# Patient Record
Sex: Female | Born: 1990 | Race: White | Hispanic: No | Smoking: Never smoker
Health system: Southern US, Community
[De-identification: ages and names within clinical notes are randomized; demographics above are authoritative.]

## PROBLEM LIST (undated history)

## (undated) ENCOUNTER — Inpatient Hospital Stay (HOSPITAL_COMMUNITY): Payer: Medicaid Other

## (undated) ENCOUNTER — Inpatient Hospital Stay (HOSPITAL_COMMUNITY): Payer: Self-pay

## (undated) DIAGNOSIS — N83209 Unspecified ovarian cyst, unspecified side: Secondary | ICD-10-CM

## (undated) DIAGNOSIS — R001 Bradycardia, unspecified: Secondary | ICD-10-CM

## (undated) DIAGNOSIS — N61 Mastitis without abscess: Principal | ICD-10-CM

## (undated) DIAGNOSIS — R519 Headache, unspecified: Secondary | ICD-10-CM

## (undated) DIAGNOSIS — R569 Unspecified convulsions: Secondary | ICD-10-CM

## (undated) DIAGNOSIS — Z87898 Personal history of other specified conditions: Secondary | ICD-10-CM

## (undated) DIAGNOSIS — R51 Headache: Secondary | ICD-10-CM

## (undated) DIAGNOSIS — G43909 Migraine, unspecified, not intractable, without status migrainosus: Secondary | ICD-10-CM

## (undated) DIAGNOSIS — R87629 Unspecified abnormal cytological findings in specimens from vagina: Secondary | ICD-10-CM

## (undated) DIAGNOSIS — B999 Unspecified infectious disease: Secondary | ICD-10-CM

## (undated) HISTORY — DX: Mastitis without abscess: N61.0

## (undated) HISTORY — DX: Personal history of other specified conditions: Z87.898

## (undated) HISTORY — DX: Unspecified abnormal cytological findings in specimens from vagina: R87.629

---
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
## 2012-04-16 ENCOUNTER — Other Ambulatory Visit (HOSPITAL_COMMUNITY): Payer: Self-pay | Admitting: Family Medicine

## 2012-04-16 DIAGNOSIS — R102 Pelvic and perineal pain: Secondary | ICD-10-CM

## 2012-04-16 DIAGNOSIS — N949 Unspecified condition associated with female genital organs and menstrual cycle: Secondary | ICD-10-CM

## 2012-04-16 DIAGNOSIS — R109 Unspecified abdominal pain: Secondary | ICD-10-CM

## 2012-04-18 ENCOUNTER — Other Ambulatory Visit (HOSPITAL_COMMUNITY): Payer: Self-pay | Admitting: Family Medicine

## 2012-04-18 ENCOUNTER — Ambulatory Visit (HOSPITAL_COMMUNITY)
Admission: RE | Admit: 2012-04-18 | Discharge: 2012-04-18 | Disposition: A | Discharge status: Home / Self Care | Payer: Self-pay | Source: Ambulatory Visit | Attending: Family Medicine | Admitting: Family Medicine

## 2012-04-18 ENCOUNTER — Ambulatory Visit (HOSPITAL_COMMUNITY): Admission: RE | Admit: 2012-04-18 | Payer: Self-pay | Source: Ambulatory Visit

## 2012-04-18 DIAGNOSIS — N949 Unspecified condition associated with female genital organs and menstrual cycle: Secondary | ICD-10-CM

## 2012-04-18 DIAGNOSIS — N83209 Unspecified ovarian cyst, unspecified side: Secondary | ICD-10-CM | POA: Insufficient documentation

## 2012-04-18 DIAGNOSIS — R102 Pelvic and perineal pain unspecified side: Secondary | ICD-10-CM

## 2012-04-18 DIAGNOSIS — R109 Unspecified abdominal pain: Secondary | ICD-10-CM

## 2012-05-25 IMAGING — CT CT ABD-PELV W/O CM
3 of 4 series · 7 of 46 positions shown, 13 images · non-contrast
Comparison: 05/31/2001

CLINICAL DATA: Left-sided flank pain.

CT ABDOMEN AND PELVIS WITHOUT CONTRAST
TECHNIQUE: Multidetector CT imaging of the abdomen and pelvis was
performed following the standard protocol without intravenous
contrast.

[Series 3: lung 5.0 b60f · axial · 0.70mm/px · z∈[+824,+864]mm · 3 of 18 slices shown, 7 images]
[im 5/18  soft-tissue]
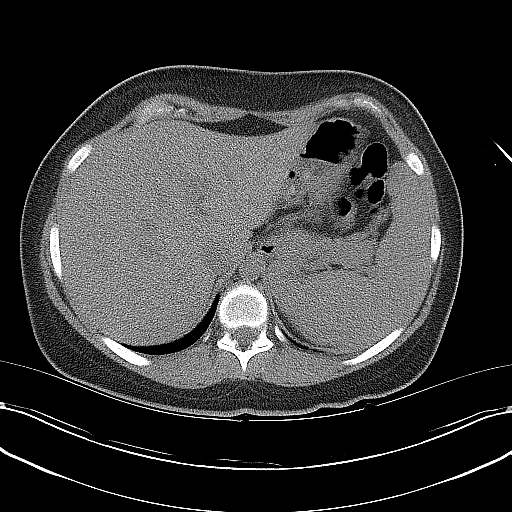
[im 5/18  lung]
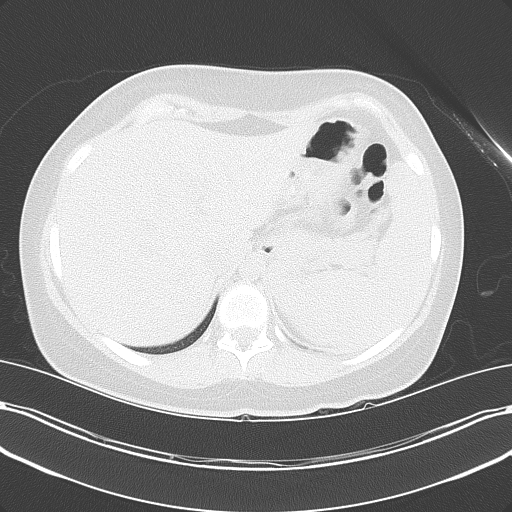
[im 5/18  bone]
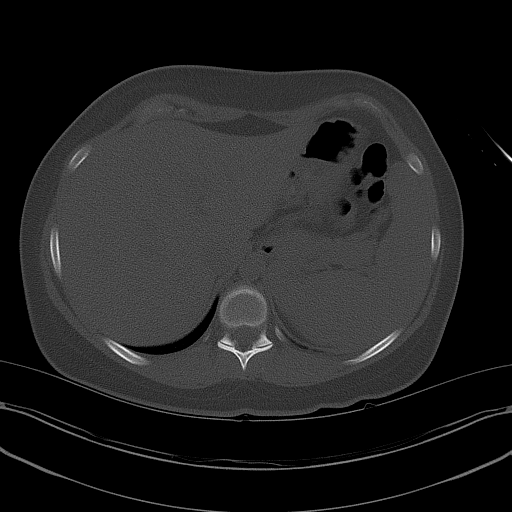
[im 9/18  soft-tissue]
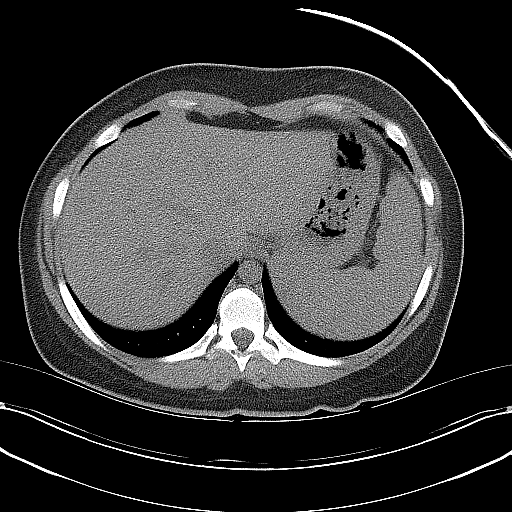
[im 9/18  lung]
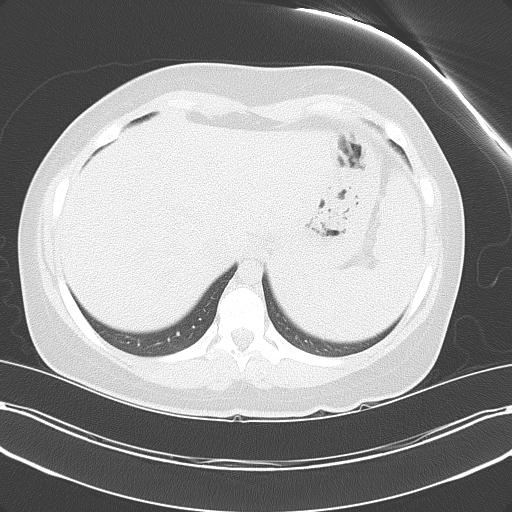
[im 13/18  soft-tissue]
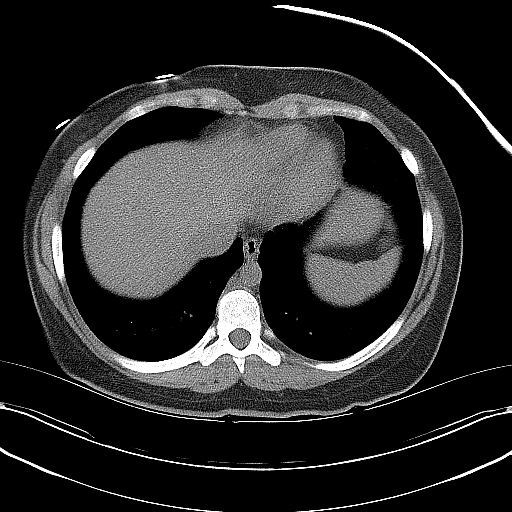
[im 13/18  lung]
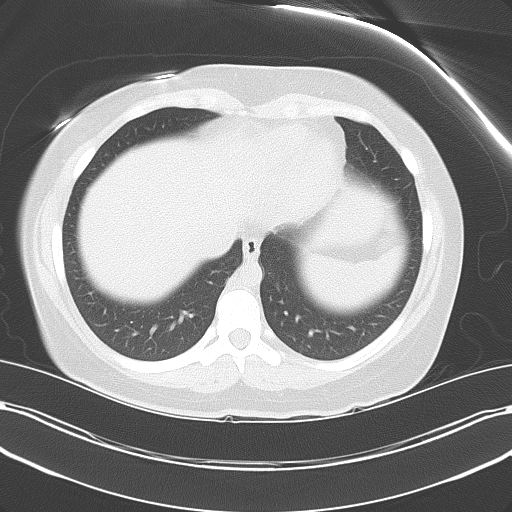

[Series 4: mpr coronal (id) · coronal · 0.67mm/px · 3 of 70 slices shown, 4 images]
[im 24/70  soft-tissue]
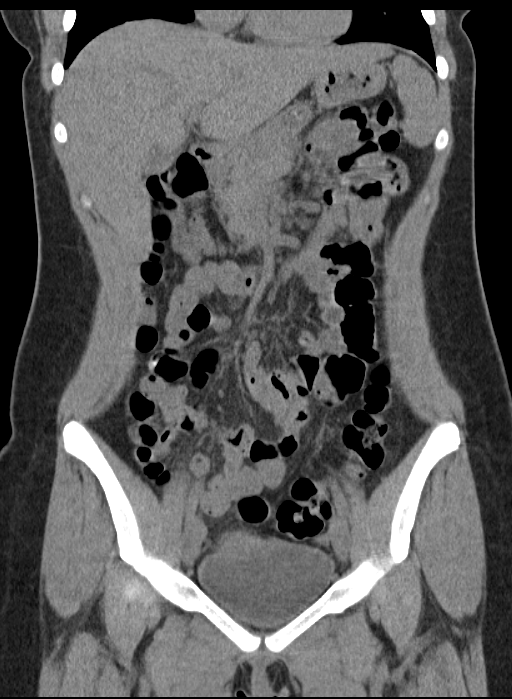
[im 31/70  soft-tissue]
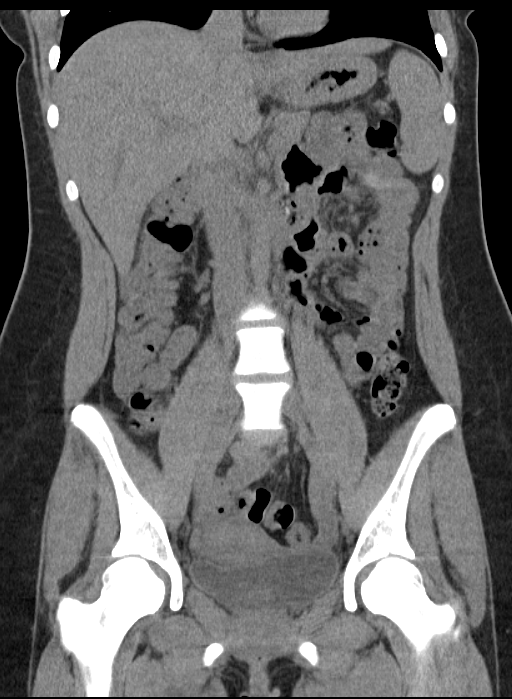
[im 31/70  bone]
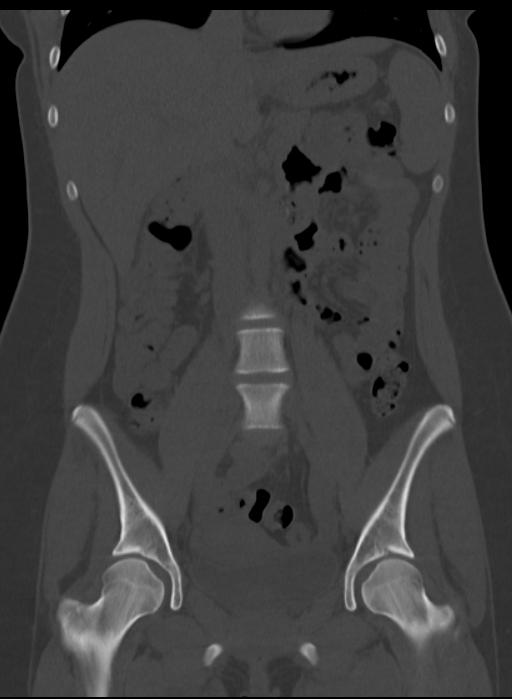
[im 39/70  soft-tissue]
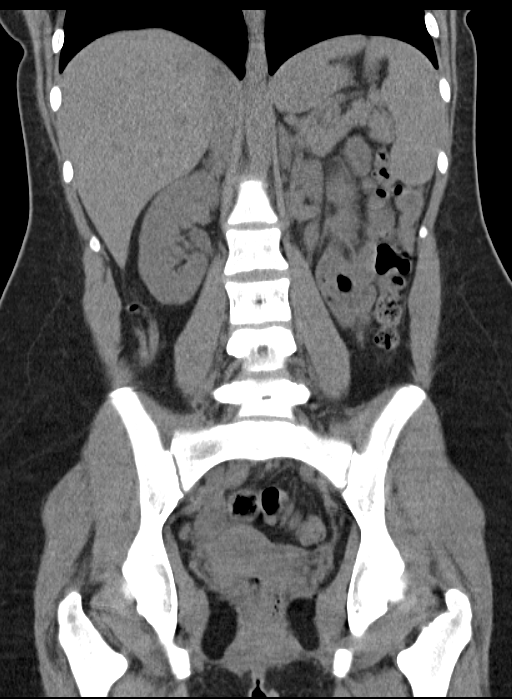

[Series 5: mpr sagittal (id) · sagittal · 0.48mm/px · 1 of 116 slices shown, 2 images]
[im 39/116  soft-tissue]
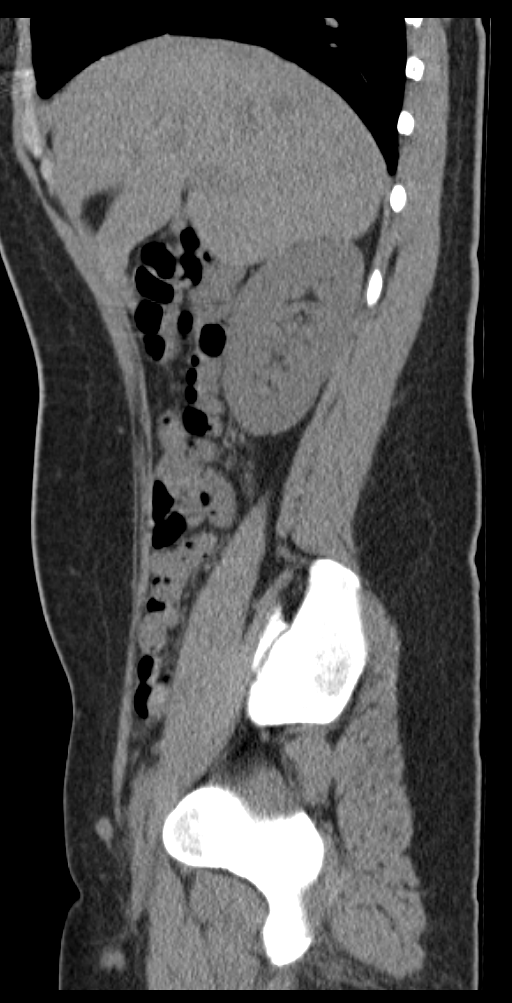
[im 39/116  bone]
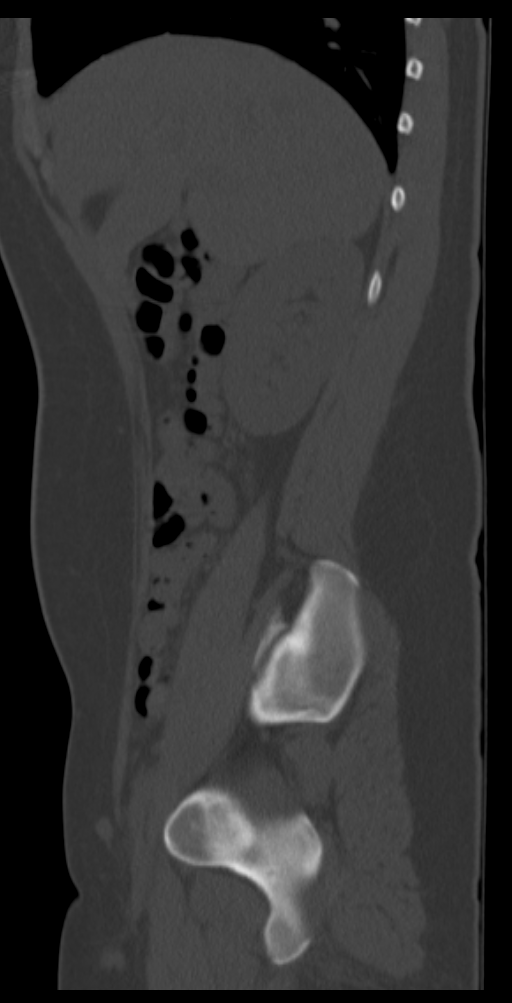

[7 of 46 positions shown; findings below may reference images not displayed]

FINDINGS: Lung bases are clear.  Gallbladder is contracted.  Other
unenhanced abdominal viscera are unremarkable.  No radiopaque renal
or ureteral calculus.

The appendix is normal.  Unenhanced bowel is without focal
segmental dilatation or apparent wall thickening.  Trace pelvic
fluid in the cul-de-sac is likely physiologic in this premenopausal
patient.  Uterus and ovaries are normal.  No lymphadenopathy.  No
free air.  No acute bony abnormality.
IMPRESSION: Trace free pelvic fluid likely physiologic in this premenopausal
female.  No acute intra-abdominal and pelvic pathology otherwise.

## 2012-07-16 ENCOUNTER — Emergency Department (HOSPITAL_COMMUNITY)
Admission: EM | Admit: 2012-07-16 | Discharge: 2012-07-16 | Discharge status: Against Medical Advice | Payer: Medicaid Other | Attending: Emergency Medicine | Admitting: Emergency Medicine

## 2012-07-16 ENCOUNTER — Encounter (HOSPITAL_COMMUNITY): Payer: Self-pay | Admitting: *Deleted

## 2012-07-16 DIAGNOSIS — O9989 Other specified diseases and conditions complicating pregnancy, childbirth and the puerperium: Secondary | ICD-10-CM | POA: Insufficient documentation

## 2012-07-16 DIAGNOSIS — O209 Hemorrhage in early pregnancy, unspecified: Secondary | ICD-10-CM | POA: Insufficient documentation

## 2012-07-16 DIAGNOSIS — R109 Unspecified abdominal pain: Secondary | ICD-10-CM | POA: Insufficient documentation

## 2012-07-16 LAB — US OB COMP LESS 14 WKS

## 2012-07-16 NOTE — ED Notes (Signed)
abd cramping and spotting. Had cramping on Friday and started again today with spotting.  [redacted] weeks pregnant  No N/V

## 2012-07-16 NOTE — ED Notes (Signed)
No answer

## 2012-10-16 ENCOUNTER — Emergency Department (HOSPITAL_COMMUNITY)
Admission: EM | Admit: 2012-10-16 | Discharge: 2012-10-16 | Disposition: A | Discharge status: Home / Self Care | Payer: Medicaid Other | Attending: Emergency Medicine | Admitting: Emergency Medicine

## 2012-10-16 ENCOUNTER — Encounter (HOSPITAL_COMMUNITY): Payer: Self-pay | Admitting: Emergency Medicine

## 2012-10-16 DIAGNOSIS — R197 Diarrhea, unspecified: Secondary | ICD-10-CM | POA: Insufficient documentation

## 2012-10-16 DIAGNOSIS — O210 Mild hyperemesis gravidarum: Secondary | ICD-10-CM | POA: Insufficient documentation

## 2012-10-16 DIAGNOSIS — R112 Nausea with vomiting, unspecified: Secondary | ICD-10-CM

## 2012-10-16 DIAGNOSIS — Z8679 Personal history of other diseases of the circulatory system: Secondary | ICD-10-CM | POA: Insufficient documentation

## 2012-10-16 DIAGNOSIS — R109 Unspecified abdominal pain: Secondary | ICD-10-CM | POA: Insufficient documentation

## 2012-10-16 DIAGNOSIS — Z880 Allergy status to penicillin: Secondary | ICD-10-CM | POA: Insufficient documentation

## 2012-10-16 DIAGNOSIS — Z79899 Other long term (current) drug therapy: Secondary | ICD-10-CM | POA: Insufficient documentation

## 2012-10-16 LAB — POCT PREGNANCY, URINE: Preg Test, Ur: POSITIVE — AB

## 2012-10-16 MED ORDER — PROMETHAZINE HCL 25 MG/ML IJ SOLN
25.0000 mg | Freq: Once | INTRAMUSCULAR | Status: AC
  Administered 2012-10-16: 25 mg via INTRAVENOUS
  Filled 2012-10-16: qty 1

## 2012-10-16 MED ORDER — ONDANSETRON HCL 4 MG PO TABS: 4.0000 mg | ORAL_TABLET | Freq: Four times a day (QID) | ORAL | Status: DC

## 2012-10-16 MED ORDER — ONDANSETRON 4 MG PO TBDP
4.0000 mg | ORAL_TABLET | Freq: Once | ORAL | Status: AC
  Administered 2012-10-16: 4 mg via ORAL
  Filled 2012-10-16: qty 1

## 2012-10-16 MED ORDER — SODIUM CHLORIDE 0.9 % IV BOLUS (SEPSIS)
1000.0000 mL | Freq: Once | INTRAVENOUS | Status: AC
  Administered 2012-10-16: 1000 mL via INTRAVENOUS

## 2012-10-16 NOTE — ED Notes (Signed)
Pt is currently eating ice chips 

## 2012-10-16 NOTE — ED Provider Notes (Signed)
CSN: [id number]     Arrival date & time 10/16/12  0448 History   First MD Initiated Contact with Patient 10/16/12 0550     Chief Complaint  Patient presents with  . Abdominal Pain  . Nausea  . Emesis   (Consider location/radiation/quality/duration/timing/severity/associated sxs/prior Treatment) HPI Hx provided by patient. Nausea vomiting and diarrhea onset last night around 11 PM. Her daughter at home is sick with vomiting tonight as well.  No fevers. Has some on and off abdominal cramping. No blood in emesis or stools. No dysuria. No hematuria. No chest pain or difficulty breathing.   5 mo preg followed by OB in [place], has placenta previa no vag bleeding. No other issues with preg  Past Medical History  Diagnosis Date  . Bradycardia 2012    one episode, 3-day hospitalization, no Rx  . Pregnant    History reviewed. No pertinent past surgical history. History reviewed. No pertinent family history. History  Substance Use Topics  . Smoking status: Never Smoker   . Smokeless tobacco: Never Used  . Alcohol Use: No   OB History   Grav Para Term Preterm Abortions TAB SAB Ect Mult Living   4 2 2  0 1 0 1 0 0 2     Review of Systems  Constitutional: Negative for fever and chills.  Eyes: Negative for pain.  Respiratory: Negative for shortness of breath.   Cardiovascular: Negative for chest pain.  Gastrointestinal: Positive for nausea, vomiting and diarrhea.  Genitourinary: Negative for dysuria.  Musculoskeletal: Negative for back pain, neck pain and neck stiffness.  Skin: Negative for rash.  Neurological: Negative for headaches.  All other systems reviewed and are negative.    Allergies  Penicillins  Home Medications   Current Outpatient Rx  Name  Route  Sig  Dispense  Refill  . ibuprofen (ADVIL,MOTRIN) 600 MG tablet   Oral   Take 1 tablet (600 mg total) by mouth every 6 (six) hours.   30 tablet   0   . Prenatal Vit-Fe Fumarate-FA (PRENATAL MULTIVITAMIN) TABS    Oral   Take 1 tablet by mouth daily.          BP 108/78  Pulse 122  Temp(Src) 98.4 F (36.9 C) (Oral)  Resp 17  Wt 195 lb (88.451 kg)  BMI 35.66 kg/m2  SpO2 97%  LMP 05/28/2012 Physical Exam  Constitutional: She is oriented to person, place, and time. She appears well-developed and well-nourished.  HENT:  Head: Normocephalic and atraumatic.  Mouth/Throat: Oropharynx is clear and moist.  Eyes: EOM are normal. Pupils are equal, round, and reactive to light. No scleral icterus.  Neck: Neck supple.  Cardiovascular: Normal rate, regular rhythm and intact distal pulses.   Pulmonary/Chest: Effort normal and breath sounds normal. No respiratory distress.  Abdominal: Soft. Bowel sounds are normal. She exhibits no distension and no mass. There is no tenderness. There is no rebound and no guarding.  No tenderness with deep palpation throughout  Musculoskeletal: Normal range of motion. She exhibits no edema.  Neurological: She is alert and oriented to person, place, and time.  Skin: Skin is warm and dry.    ED Course  Procedures (including critical care time) Labs Review Labs Reviewed  PREGNANCY, URINE   PO Zofran provided. On recheck abdominal exam unchanged and patient states she is still nauseated. She is requesting IV fluids   7:02 AM feeling much better, tolerating POs. Stable for d/c home and f/u OB GYN. Rx zofran and strict return  precautions verbalized as understood.   MDM  DX: N/V/D, 5 months preg  Symptoms likely viral given sick contact at home. No vag bleeding, ABd pain or tenderness in the ED.  Improved with IVFs and antiemetics VS and nurses notes reviewed    Sunnie Nielsen, MD 10/16/12 [phone]

## 2012-10-16 NOTE — ED Notes (Signed)
Pt c/o abd pain with n/v since 2300

## 2013-02-21 ENCOUNTER — Encounter: Payer: Self-pay | Admitting: Advanced Practice Midwife

## 2013-03-22 ENCOUNTER — Encounter: Payer: Self-pay | Admitting: *Deleted

## 2013-06-24 ENCOUNTER — Emergency Department (HOSPITAL_COMMUNITY)
Admission: EM | Admit: 2013-06-24 | Discharge: 2013-06-24 | Disposition: A | Discharge status: Home / Self Care | Payer: Medicaid Other | Attending: Emergency Medicine | Admitting: Emergency Medicine

## 2013-06-24 ENCOUNTER — Emergency Department (HOSPITAL_COMMUNITY): Payer: Medicaid Other

## 2013-06-24 ENCOUNTER — Encounter (HOSPITAL_COMMUNITY): Payer: Self-pay | Admitting: Emergency Medicine

## 2013-06-24 DIAGNOSIS — Z792 Long term (current) use of antibiotics: Secondary | ICD-10-CM | POA: Insufficient documentation

## 2013-06-24 DIAGNOSIS — Z8679 Personal history of other diseases of the circulatory system: Secondary | ICD-10-CM | POA: Insufficient documentation

## 2013-06-24 DIAGNOSIS — Z3202 Encounter for pregnancy test, result negative: Secondary | ICD-10-CM | POA: Insufficient documentation

## 2013-06-24 DIAGNOSIS — N390 Urinary tract infection, site not specified: Secondary | ICD-10-CM

## 2013-06-24 DIAGNOSIS — Z880 Allergy status to penicillin: Secondary | ICD-10-CM | POA: Insufficient documentation

## 2013-06-24 LAB — URINALYSIS, ROUTINE W REFLEX MICROSCOPIC
Bilirubin Urine: NEGATIVE
Glucose, UA: NEGATIVE mg/dL
Hgb urine dipstick: NEGATIVE
Ketones, ur: NEGATIVE mg/dL
Nitrite: NEGATIVE
Protein, ur: NEGATIVE mg/dL
Specific Gravity, Urine: >1.03 — ABNORMAL HIGH (ref 1.005–1.030)
UROBILINOGEN UA: 0.2 mg/dL (ref 0.0–1.0)
pH: 5.5 (ref 5.0–8.0)

## 2013-06-24 LAB — URINE MICROSCOPIC-ADD ON

## 2013-06-24 LAB — PREGNANCY, URINE: PREG TEST UR: NEGATIVE

## 2013-06-24 MED ORDER — SULFAMETHOXAZOLE-TMP DS 800-160 MG PO TABS: 1.0000 | ORAL_TABLET | Freq: Two times a day (BID) | ORAL | Status: DC

## 2013-06-24 NOTE — ED Notes (Signed)
Abdominal pain times 2 months.  "I think I have an ovarian cyst and UTI"

## 2013-06-24 NOTE — ED Provider Notes (Signed)
CSN: [id number]     Arrival date & time 06/24/13  1046 History  This chart was scribed for American Expressathan R. Rubin PayorPickering, MD by Ardelia Memsylan Malpass, ED Scribe. This patient was seen in room APA17/APA17 and the patient's care was started at 12:12 PM.   Chief Complaint  Patient presents with  . Abdominal Pain    The history is provided by the patient. No language interpreter was used.    HPI Comments: Sandra Frost is a 23 y.o. female who presents to the Emergency Department complaining of intermittent, moderate, sharp LLQ abdominal pain over the past 2 months. She states that she has a history of ovarian cysts and that her current symptoms feel similar. She states that her pain is worsened with walking. She states that she had a vaginal delivery without complication 4 months ago, and that she has since resumed her normal menstrual cycle. She states that she had a normal pelvic exam about 1 month ago. She states that she is not breastfeeding. She denies diarrhea, constipation, vaginal bleeding or discharge, dysuria or any other symptoms.   Past Medical History  Diagnosis Date  . Bradycardia 2012    one episode, 3-day hospitalization, no Rx  . Pregnant    History reviewed. No pertinent past surgical history. Family History  Problem Relation Age of Onset  . Diabetes Mother   . Seizures Father   . Asthma Daughter   . Cancer Maternal Grandmother     breast  . Heart disease Maternal Grandmother   . Stroke Maternal Grandfather   . Cancer Paternal Grandmother     breast   History  Substance Use Topics  . Smoking status: Never Smoker   . Smokeless tobacco: Never Used  . Alcohol Use: No   OB History   Grav Para Term Preterm Abortions TAB SAB Ect Mult Living   4 2 2  0 1 0 1 0 0 2     Review of Systems  Gastrointestinal: Positive for abdominal pain. Negative for diarrhea and constipation.  Genitourinary: Negative for dysuria, vaginal bleeding and vaginal discharge.  All other systems reviewed and  are negative.   Allergies  Penicillins  Home Medications   Prior to Admission medications   Medication Sig Start Date End Date Taking? Authorizing Provider  sulfamethoxazole-trimethoprim (BACTRIM DS) 800-160 MG per tablet Take 1 tablet by mouth 2 (two) times daily. 06/24/13   Juliet RudeNathan R. Pickering, MD   Triage Vitals: BP 117/76  Pulse 89  Temp(Src) 98 F (36.7 C) (Oral)  Resp 18  SpO2 100%  LMP 05/28/2012  Physical Exam  Nursing note and vitals reviewed. Constitutional: She is oriented to person, place, and time. She appears well-developed and well-nourished. No distress.  HENT:  Head: Normocephalic and atraumatic.  Eyes: Conjunctivae and EOM are normal.  Neck: Neck supple. No tracheal deviation present.  Cardiovascular: Normal rate.   Pulmonary/Chest: Effort normal. No respiratory distress.  Abdominal: Soft. There is tenderness. There is no rebound and no guarding.  Genitourinary:  No CVA tenderness  Musculoskeletal: Normal range of motion.  Neurological: She is alert and oriented to person, place, and time.  Skin: Skin is warm and dry. No rash noted.  Psychiatric: She has a normal mood and affect. Her behavior is normal.    ED Course  Procedures (including critical care time)  DIAGNOSTIC STUDIES: Oxygen Saturation is 100% on RA, normal by my interpretation.    COORDINATION OF CARE: 12:16 PM- Discussed plan to obtain UA and diagnostic radiology. Pt advised  of plan for treatment and pt agrees.  Labs Review Labs Reviewed  URINALYSIS, ROUTINE W REFLEX MICROSCOPIC - Abnormal; Notable for the following:    APPearance HAZY (*)    Specific Gravity, Urine >1.030 (*)    Leukocytes, UA TRACE (*)    All other components within normal limits  URINE MICROSCOPIC-ADD ON - Abnormal; Notable for the following:    Squamous Epithelial / LPF MANY (*)    Bacteria, UA MANY (*)    All other components within normal limits  PREGNANCY, URINE    Imaging Review [place] Transvaginal  Non-ob  06/24/2013   CLINICAL DATA:  Left lower quadrant pain.  EXAM: TRANSABDOMINAL AND TRANSVAGINAL ULTRASOUND OF PELVIS  DOPPLER ULTRASOUND OF OVARIES  TECHNIQUE: Both transabdominal and transvaginal ultrasound examinations of the pelvis were performed. Transabdominal technique was performed for global imaging of the pelvis including uterus, ovaries, adnexal regions, and pelvic cul-de-sac.  It was necessary to proceed with endovaginal exam following the transabdominal exam to visualize the uterus, endometrium and ovaries. Color and duplex Doppler ultrasound was utilized to evaluate blood flow to the ovaries.  COMPARISON:  Ultrasound of April 18, 2012.  FINDINGS: Uterus  Measurements: 8.3 x 6.7 x 4.7 cm. No fibroids or other mass visualized.  Endometrium  Thickness: 11.6 mm.  No focal abnormality visualized.  Right ovary  Measurements: 3.9 x 2.4 x 2.3 cm. Normal appearance/no adnexal mass.  Left ovary  Measurements: 3.1 x 2.3 x 2.2 cm. Normal appearance/no adnexal mass.  Pulsed Doppler evaluation of both ovaries demonstrates normal low-resistance arterial and venous waveforms.  Other findings  Trace fluid is noted which most likely is physiologic.  IMPRESSION: Normal pelvic ultrasound.   Electronically Signed   By: Roque LiasJames  Green M.D.   On: 06/24/2013 13:29   [place] Pelvis Complete  06/24/2013   CLINICAL DATA:  Left lower quadrant pain.  EXAM: TRANSABDOMINAL AND TRANSVAGINAL ULTRASOUND OF PELVIS  DOPPLER ULTRASOUND OF OVARIES  TECHNIQUE: Both transabdominal and transvaginal ultrasound examinations of the pelvis were performed. Transabdominal technique was performed for global imaging of the pelvis including uterus, ovaries, adnexal regions, and pelvic cul-de-sac.  It was necessary to proceed with endovaginal exam following the transabdominal exam to visualize the uterus, endometrium and ovaries. Color and duplex Doppler ultrasound was utilized to evaluate blood flow to the ovaries.  COMPARISON:  Ultrasound of April 18, 2012.  FINDINGS: Uterus  Measurements: 8.3 x 6.7 x 4.7 cm. No fibroids or other mass visualized.  Endometrium  Thickness: 11.6 mm.  No focal abnormality visualized.  Right ovary  Measurements: 3.9 x 2.4 x 2.3 cm. Normal appearance/no adnexal mass.  Left ovary  Measurements: 3.1 x 2.3 x 2.2 cm. Normal appearance/no adnexal mass.  Pulsed Doppler evaluation of both ovaries demonstrates normal low-resistance arterial and venous waveforms.  Other findings  Trace fluid is noted which most likely is physiologic.  IMPRESSION: Normal pelvic ultrasound.   Electronically Signed   By: Roque LiasJames  Green M.D.   On: 06/24/2013 13:29   [place] Art/ven Flow Abd Pelv Doppler  06/24/2013   CLINICAL DATA:  Left lower quadrant pain.  EXAM: TRANSABDOMINAL AND TRANSVAGINAL ULTRASOUND OF PELVIS  DOPPLER ULTRASOUND OF OVARIES  TECHNIQUE: Both transabdominal and transvaginal ultrasound examinations of the pelvis were performed. Transabdominal technique was performed for global imaging of the pelvis including uterus, ovaries, adnexal regions, and pelvic cul-de-sac.  It was necessary to proceed with endovaginal exam following the transabdominal exam to visualize the uterus, endometrium and ovaries. Color and duplex Doppler  ultrasound was utilized to evaluate blood flow to the ovaries.  COMPARISON:  Ultrasound of April 18, 2012.  FINDINGS: Uterus  Measurements: 8.3 x 6.7 x 4.7 cm. No fibroids or other mass visualized.  Endometrium  Thickness: 11.6 mm.  No focal abnormality visualized.  Right ovary  Measurements: 3.9 x 2.4 x 2.3 cm. Normal appearance/no adnexal mass.  Left ovary  Measurements: 3.1 x 2.3 x 2.2 cm. Normal appearance/no adnexal mass.  Pulsed Doppler evaluation of both ovaries demonstrates normal low-resistance arterial and venous waveforms.  Other findings  Trace fluid is noted which most likely is physiologic.  IMPRESSION: Normal pelvic ultrasound.   Electronically Signed   By: Roque Lias M.D.   On: 06/24/2013 13:29     EKG  Interpretation None      MDM   Final diagnoses:  Urinary tract infection without hematuria, site unspecified    Patient with abdominal pain. Left lower quadrant. States feels like previous ovarian cyst. Ultrasound reassuring. Has had exam by her obstetrician for the same. Urinalysis shows UTI. Will discharge home    I personally performed the services described in this documentation, which was scribed in my presence. The recorded information has been reviewed and is accurate.    Juliet Rude. Rubin Payor, MD 06/24/13 [phone]

## 2013-06-24 NOTE — Discharge Instructions (Signed)

## 2013-06-28 ENCOUNTER — Emergency Department (HOSPITAL_COMMUNITY)
Admission: EM | Admit: 2013-06-28 | Discharge: 2013-06-28 | Disposition: A | Discharge status: Home / Self Care | Payer: Medicaid Other | Attending: Emergency Medicine | Admitting: Emergency Medicine

## 2013-06-28 ENCOUNTER — Encounter (HOSPITAL_COMMUNITY): Payer: Self-pay | Admitting: Emergency Medicine

## 2013-06-28 ENCOUNTER — Ambulatory Visit: Payer: Self-pay | Admitting: Adult Health

## 2013-06-28 DIAGNOSIS — Z880 Allergy status to penicillin: Secondary | ICD-10-CM | POA: Insufficient documentation

## 2013-06-28 DIAGNOSIS — Z79899 Other long term (current) drug therapy: Secondary | ICD-10-CM | POA: Insufficient documentation

## 2013-06-28 DIAGNOSIS — R1013 Epigastric pain: Secondary | ICD-10-CM | POA: Insufficient documentation

## 2013-06-28 DIAGNOSIS — Z8679 Personal history of other diseases of the circulatory system: Secondary | ICD-10-CM | POA: Insufficient documentation

## 2013-06-28 LAB — CBC WITH DIFFERENTIAL/PLATELET
BASOS PCT: 0 % (ref 0–1)
Basophils Absolute: 0 10*3/uL (ref 0.0–0.1)
EOS ABS: 0.2 10*3/uL (ref 0.0–0.7)
EOS PCT: 2 % (ref 0–5)
HCT: 40.4 % (ref 36.0–46.0)
HEMOGLOBIN: 14.2 g/dL (ref 12.0–15.0)
Lymphocytes Relative: 29 % (ref 12–46)
Lymphs Abs: 2.2 10*3/uL (ref 0.7–4.0)
MCH: 30 pg (ref 26.0–34.0)
MCHC: 35.1 g/dL (ref 30.0–36.0)
MCV: 85.2 fL (ref 78.0–100.0)
MONO ABS: 0.5 10*3/uL (ref 0.1–1.0)
MONOS PCT: 7 % (ref 3–12)
Neutro Abs: 4.7 10*3/uL (ref 1.7–7.7)
Neutrophils Relative %: 62 % (ref 43–77)
Platelets: 286 10*3/uL (ref 150–400)
RBC: 4.74 MIL/uL (ref 3.87–5.11)
RDW: 13.1 % (ref 11.5–15.5)
WBC: 7.6 10*3/uL (ref 4.0–10.5)

## 2013-06-28 LAB — COMPREHENSIVE METABOLIC PANEL
ALBUMIN: 3.9 g/dL (ref 3.5–5.2)
ALT: 19 U/L (ref 0–35)
AST: 18 U/L (ref 0–37)
Alkaline Phosphatase: 77 U/L (ref 39–117)
BUN: 15 mg/dL (ref 6–23)
CO2: 29 mEq/L (ref 19–32)
CREATININE: 0.67 mg/dL (ref 0.50–1.10)
Calcium: 9.1 mg/dL (ref 8.4–10.5)
Chloride: 101 mEq/L (ref 96–112)
GFR calc Af Amer: >90 mL/min (ref >90)
GFR calc non Af Amer: >90 mL/min (ref >90)
Glucose, Bld: 90 mg/dL (ref 70–99)
Potassium: 3.9 mEq/L (ref 3.7–5.3)
Sodium: 141 mEq/L (ref 137–147)
Total Bilirubin: 0.2 mg/dL — ABNORMAL LOW (ref 0.3–1.2)
Total Protein: 7.3 g/dL (ref 6.0–8.3)

## 2013-06-28 LAB — LIPASE, BLOOD: LIPASE: 38 U/L (ref 11–59)

## 2013-06-28 LAB — AMYLASE: AMYLASE: 37 U/L (ref 0–105)

## 2013-06-28 MED ORDER — OMEPRAZOLE 20 MG PO CPDR: 20.0000 mg | DELAYED_RELEASE_CAPSULE | Freq: Every day | ORAL | Status: DC

## 2013-06-28 MED ORDER — SUCRALFATE 1 G PO TABS: 1.0000 g | ORAL_TABLET | Freq: Four times a day (QID) | ORAL | Status: DC

## 2013-06-28 NOTE — ED Provider Notes (Addendum)
CSN: [id number]     Arrival date & time 06/28/13  1250 History  This chart was scribed for Gerhard Munchobert Lee Kuang, MD by Shari HeritageAisha Amuda, ED Scribe. The patient was seen in room APA06/APA06. Patient's care was started at 2:40 PM.   Chief Complaint  Patient presents with  . Abdominal Pain     The history is provided by the patient. No language interpreter was used.    HPI Comments: Sandra Frost is a 23 y.o. female who presents to the Emergency Department complaining of non-radiating, constant, epigastric abdominal pain that began last night at about 10 PM. She has been taking Tylenol at home for pain. She denies associated nausea, vomiting, chest pain or shortness of breath. Patient further denies fever. Patient was seen here on 06/24/13 for evaluation of moderate LLQ pain and was ultimately treated for a UTI. She had a vaginal delivery about 4 months ago without complication. She denies any chronic medical conditions.  She states that the pain is similar to that she experienced during prior pregnancy.    Past Medical History  Diagnosis Date  . Bradycardia 2012    one episode, 3-day hospitalization, no Rx   History reviewed. No pertinent past surgical history. Family History  Problem Relation Age of Onset  . Diabetes Mother   . Seizures Father   . Asthma Daughter   . Cancer Maternal Grandmother     breast  . Heart disease Maternal Grandmother   . Stroke Maternal Grandfather   . Cancer Paternal Grandmother     breast   History  Substance Use Topics  . Smoking status: Never Smoker   . Smokeless tobacco: Never Used  . Alcohol Use: No   OB History   Grav Para Term Preterm Abortions TAB SAB Ect Mult Living   4 2 2  0 1 0 1 0 0 2     Review of Systems  Constitutional:       Per HPI, otherwise negative  HENT:       Per HPI, otherwise negative  Respiratory:       Per HPI, otherwise negative  Cardiovascular:       Per HPI, otherwise negative  Gastrointestinal: Negative for nausea  and vomiting.  Endocrine:       Negative aside from HPI  Genitourinary:       Neg aside from HPI   Musculoskeletal:       Per HPI, otherwise negative  Skin: Negative.   Neurological: Negative for syncope.      Allergies  Penicillins  Home Medications   Prior to Admission medications   Medication Sig Start Date End Date Taking? Authorizing Provider  sulfamethoxazole-trimethoprim (BACTRIM DS) 800-160 MG per tablet Take 1 tablet by mouth 2 (two) times daily. 06/24/13   Juliet RudeNathan R. Rubin PayorPickering, MD   Triage Vitals: BP 118/70  Pulse 86  Temp(Src) 98.3 F (36.8 C)  Resp 17  Ht 5\' 1"  (1.549 m)  Wt 193 lb (87.544 kg)  BMI 36.49 kg/m2  SpO2 98%  LMP 05/23/2012  Breastfeeding? Unknown Physical Exam  Nursing note and vitals reviewed. Constitutional: She is oriented to person, place, and time. She appears well-developed and well-nourished. No distress.  HENT:  Head: Normocephalic and atraumatic.  Eyes: Conjunctivae and EOM are normal.  Cardiovascular: Normal rate and regular rhythm.   Pulmonary/Chest: Effort normal and breath sounds normal. No stridor. No respiratory distress.  Abdominal: Soft. She exhibits no distension. There is tenderness. There is no tenderness at McBurney's point and negative  Murphy's sign.  Non-peritoneal. Mild epigastric tenderness.  Musculoskeletal: She exhibits no edema.  Neurological: She is alert and oriented to person, place, and time. No cranial nerve deficit.  Skin: Skin is warm and dry.  Psychiatric: She has a normal mood and affect.    ED Course  Procedures (including critical care time)  COORDINATION OF CARE: 2:42 PM- Patient informed of current plan for treatment and evaluation and agrees with plan at this time.     Labs Review Labs Reviewed  COMPREHENSIVE METABOLIC PANEL - Abnormal; Notable for the following:    Total Bilirubin 0.2 (*)    All other components within normal limits  CBC WITH DIFFERENTIAL  AMYLASE  LIPASE, BLOOD   After  the initial evaluation I reviewed the patient's chart, included ultrasound, labs from 4 days ago.  MDM   Final diagnoses:  Epigastric pain     I personally performed the services described in this documentation, which was scribed in my presence. The recorded information has been reviewed and is accurate.   Patient presents with new epigastric pain.  Patient is awake, alert, afebrile, with a soft, nontender to no abdomen, reassuring labs. No evidence for hepatobiliary dysfunction. No evidence for occult ACS. No evidence of respiratory complaints. Patient's symptoms most consistent with gastroesophageal irritation. With reassuring prior ultrasound, labs, she was discharged in stable condition after initiation of therapy. She is continuing her ABX for UTI.  No e/o for progression to pyelo.    Gerhard Munchobert Jakari Sada, MD 06/28/13 1453  Gerhard Munchobert Cainen Burnham, MD 06/28/13 [phone]

## 2013-06-28 NOTE — Discharge Instructions (Signed)
As discussed, your evaluation today has been largely reassuring.  But, it is important that you monitor your condition carefully, and do not hesitate to return to the ED if you develop new, or concerning changes in your condition.  Your pain is likely due to irritation of your stomach and/or esophagus.  Otherwise, please follow-up with your physician for appropriate ongoing care.    Abdominal Pain, Women Abdominal (stomach, pelvic, or belly) pain can be caused by many things. It is important to tell your doctor:  The location of the pain.  Does it come and go or is it present all the time?  Are there things that start the pain (eating certain foods, exercise)?  Are there other symptoms associated with the pain (fever, nausea, vomiting, diarrhea)? All of this is helpful to know when trying to find the cause of the pain. CAUSES   Stomach: virus or bacteria infection, or ulcer.  Intestine: appendicitis (inflamed appendix), regional ileitis (Crohn's disease), ulcerative colitis (inflamed colon), irritable bowel syndrome, diverticulitis (inflamed diverticulum of the colon), or cancer of the stomach or intestine.  Gallbladder disease or stones in the gallbladder.  Kidney disease, kidney stones, or infection.  Pancreas infection or cancer.  Fibromyalgia (pain disorder).  Diseases of the female organs:  Uterus: fibroid (non-cancerous) tumors or infection.  Fallopian tubes: infection or tubal pregnancy.  Ovary: cysts or tumors.  Pelvic adhesions (scar tissue).  Endometriosis (uterus lining tissue growing in the pelvis and on the pelvic organs).  Pelvic congestion syndrome (female organs filling up with blood just before the menstrual period).  Pain with the menstrual period.  Pain with ovulation (producing an egg).  Pain with an IUD (intrauterine device, birth control) in the uterus.  Cancer of the female organs.  Functional pain (pain not caused by a disease, may  improve without treatment).  Psychological pain.  Depression. DIAGNOSIS  Your doctor will decide the seriousness of your pain by doing an examination.  Blood tests.  X-rays.  Ultrasound.  CT scan (computed tomography, special type of X-ray).  MRI (magnetic resonance imaging).  Cultures, for infection.  Barium enema (dye inserted in the large intestine, to better view it with X-rays).  Colonoscopy (looking in intestine with a lighted tube).  Laparoscopy (minor surgery, looking in abdomen with a lighted tube).  Major abdominal exploratory surgery (looking in abdomen with a large incision). TREATMENT  The treatment will depend on the cause of the pain.   Many cases can be observed and treated at home.  Over-the-counter medicines recommended by your caregiver.  Prescription medicine.  Antibiotics, for infection.  Birth control pills, for painful periods or for ovulation pain.  Hormone treatment, for endometriosis.  Nerve blocking injections.  Physical therapy.  Antidepressants.  Counseling with a psychologist or psychiatrist.  Minor or major surgery. HOME CARE INSTRUCTIONS   Do not take laxatives, unless directed by your caregiver.  Take over-the-counter pain medicine only if ordered by your caregiver. Do not take aspirin because it can cause an upset stomach or bleeding.  Try a clear liquid diet (broth or water) as ordered by your caregiver. Slowly move to a bland diet, as tolerated, if the pain is related to the stomach or intestine.  Have a thermometer and take your temperature several times a day, and record it.  Bed rest and sleep, if it helps the pain.  Avoid sexual intercourse, if it causes pain.  Avoid stressful situations.  Keep your follow-up appointments and tests, as your caregiver orders.  If the pain does not go away with medicine or surgery, you may try:  Acupuncture.  Relaxation exercises (yoga, meditation).  Group  therapy.  Counseling. SEEK MEDICAL CARE IF:   You notice certain foods cause stomach pain.  Your home care treatment is not helping your pain.  You need stronger pain medicine.  You want your IUD removed.  You feel faint or lightheaded.  You develop nausea and vomiting.  You develop a rash.  You are having side effects or an allergy to your medicine. SEEK IMMEDIATE MEDICAL CARE IF:   Your pain does not go away or gets worse.  You have a fever.  Your pain is felt only in portions of the abdomen. The right side could possibly be appendicitis. The left lower portion of the abdomen could be colitis or diverticulitis.  You are passing blood in your stools (bright red or black tarry stools, with or without vomiting).  You have blood in your urine.  You develop chills, with or without a fever.  You pass out. MAKE SURE YOU:   Understand these instructions.  Will watch your condition.  Will get help right away if you are not doing well or get worse. Document Released: 10/17/2006 Document Revised: 03/14/2011 Document Reviewed: 11/06/2008 [facility] Patient Information 2015 [employer] [employer], [place]. This information is not intended to replace advice given to you by your health care provider. Make sure you discuss any questions you have with your health care provider.

## 2013-06-28 NOTE — ED Notes (Signed)
Upper abd pain ,onset last night,  No NVD.  Seen here 6/22 for UTI

## 2013-07-01 ENCOUNTER — Ambulatory Visit: Payer: Self-pay | Admitting: Adult Health

## 2013-07-15 ENCOUNTER — Ambulatory Visit: Payer: Medicaid Other | Admitting: Adult Health

## 2013-07-23 ENCOUNTER — Other Ambulatory Visit: Payer: Medicaid Other | Admitting: Obstetrics and Gynecology

## 2013-10-08 ENCOUNTER — Emergency Department (HOSPITAL_COMMUNITY)
Admission: EM | Admit: 2013-10-08 | Discharge: 2013-10-08 | Disposition: A | Discharge status: Home / Self Care | Payer: Medicaid Other | Attending: Emergency Medicine | Admitting: Emergency Medicine

## 2013-10-08 ENCOUNTER — Encounter (HOSPITAL_COMMUNITY): Payer: Self-pay | Admitting: Emergency Medicine

## 2013-10-08 DIAGNOSIS — Z880 Allergy status to penicillin: Secondary | ICD-10-CM | POA: Insufficient documentation

## 2013-10-08 DIAGNOSIS — R590 Localized enlarged lymph nodes: Secondary | ICD-10-CM | POA: Insufficient documentation

## 2013-10-08 DIAGNOSIS — Z792 Long term (current) use of antibiotics: Secondary | ICD-10-CM | POA: Insufficient documentation

## 2013-10-08 MED ORDER — SULFAMETHOXAZOLE-TRIMETHOPRIM 800-160 MG PO TABS: 1.0000 | ORAL_TABLET | Freq: Two times a day (BID) | ORAL | Status: DC

## 2013-10-08 MED ORDER — SULFAMETHOXAZOLE-TMP DS 800-160 MG PO TABS
1.0000 | ORAL_TABLET | Freq: Once | ORAL | Status: AC
  Administered 2013-10-08: 1 via ORAL
  Filled 2013-10-08: qty 1

## 2013-10-08 NOTE — ED Provider Notes (Signed)
CSN: [id number]     Arrival date & time 10/08/13  0905 History  This chart was scribed for Donnetta HutchingBrian Twain Stenseth, MD, by Yevette EdwardsAngela Bracken, ED Scribe. This patient was seen in room APA07/APA07 and the patient's care was started at 9:57 AM.  First MD Initiated Contact with Patient 10/08/13 [phone]     Chief Complaint  Patient presents with  . Abdominal Pain    The history is provided by the patient. No language interpreter was used.   HPI Comments: Sandra Frost is a 23 y.o. female who presents to the Emergency Department complaining of right inguinal pain. The pain is associated with an enlarged lymph node, and she reports the pain is increased with ambulation. She rates the pain as 6/10. She reports a h/o similar symptoms several months ago which was treated with an antibiotic with resolution. She denies lymphadenopathy to her left groin, axilla, and neck. She also denies vaginal discharge, vaginal bleeding, dysuria, hematuria, nausea, abdominal pain, weight loss, or a fever. She also endorses left-sided pain which she attributes to an ovarian cyst; she has not used OTC medication to address the pain.   Past Medical History  Diagnosis Date  . Bradycardia 2012    one episode, 3-day hospitalization, no Rx   History reviewed. No pertinent past surgical history. Family History  Problem Relation Age of Onset  . Diabetes Mother   . Seizures Father   . Asthma Daughter   . Cancer Maternal Grandmother     breast  . Heart disease Maternal Grandmother   . Stroke Maternal Grandfather   . Cancer Paternal Grandmother     breast   History  Substance Use Topics  . Smoking status: Never Smoker   . Smokeless tobacco: Never Used  . Alcohol Use: No   OB History   Grav Para Term Preterm Abortions TAB SAB Ect Mult Living   4 2 2  0 1 0 1 0 0 2     Review of Systems  A complete 10 system review of systems was obtained, and all systems were negative except where indicated in the HPI and PE.    Allergies   Penicillins  Home Medications   Prior to Admission medications   Medication Sig Start Date End Date Taking? Authorizing Provider  sulfamethoxazole-trimethoprim (SEPTRA DS) 800-160 MG per tablet Take 1 tablet by mouth 2 (two) times daily. 10/08/13   Donnetta HutchingBrian Arlisa Leclere, MD   Triage Vitals: BP 116/77  Pulse 84  Temp(Src) 98.5 F (36.9 C) (Oral)  Resp 16  Ht 5\' 1"  (1.549 m)  Wt 215 lb (97.523 kg)  BMI 40.64 kg/m2  SpO2 95%  LMP 08/08/2013  Physical Exam  Nursing note and vitals reviewed. Constitutional: She is oriented to person, place, and time. She appears well-developed and well-nourished.  HENT:  Head: Normocephalic and atraumatic.  Eyes: Conjunctivae and EOM are normal. Pupils are equal, round, and reactive to light.  Neck: Normal range of motion. Neck supple.  Cardiovascular: Normal rate, regular rhythm and normal heart sounds.   Pulmonary/Chest: Effort normal and breath sounds normal.  Abdominal: Soft. Bowel sounds are normal.  Genitourinary:  Right mid-inguinal area, there is a 1.5 by 1.0 cm node.  Chaperone present during exam.   Musculoskeletal: Normal range of motion.  Neurological: She is alert and oriented to person, place, and time.  Skin: Skin is warm and dry.  Psychiatric: She has a normal mood and affect. Her behavior is normal.    ED Course  Procedures (including critical  care time)  DIAGNOSTIC STUDIES: Oxygen Saturation is 95% on room air, normal by my interpretation.    COORDINATION OF CARE:  10:01 AM- Discussed treatment plan with patient, and the patient agreed to the plan. The plan includes an antibiotic. Gave return precautions.   Labs Review Labs Reviewed - No data to display  Imaging Review No results found.   EKG Interpretation None      MDM   Final diagnoses:  Inguinal adenopathy   Patient presents with a right inguinal adenopathy. Antibiotics have helped in the past.  Will Rx Septra DS.  Patient will need biopsy if symptoms  persist.  I personally performed the services described in this documentation, which was scribed in my presence. The recorded information has been reviewed and is accurate.    Donnetta Hutching, MD 10/08/13 [phone]

## 2013-10-08 NOTE — ED Notes (Signed)
Pt states pain to right groin area, stating she believes to be from a swollen lymph node and lower abdominal pain which she believes may be from an ovarian cyst. Pain x 3 weeks.

## 2013-10-08 NOTE — Discharge Instructions (Signed)
Lymphadenopathy °Lymphadenopathy means "disease of the lymph glands." But the term is usually used to describe swollen or enlarged lymph glands, also called lymph nodes. These are the bean-shaped organs found in many locations including the neck, underarm, and groin. Lymph glands are part of the immune system, which fights infections in your body. Lymphadenopathy can occur in just one area of the body, such as the neck, or it can be generalized, with lymph node enlargement in several areas. The nodes found in the neck are the most common sites of lymphadenopathy. °CAUSES °When your immune system responds to germs (such as viruses or bacteria ), infection-fighting cells and fluid build up. This causes the glands to grow in size. Usually, this is not something to worry about. Sometimes, the glands themselves can become infected and inflamed. This is called lymphadenitis. °Enlarged lymph nodes can be caused by many diseases: °· Bacterial disease, such as strep throat or a skin infection. °· Viral disease, such as a common cold. °· Other germs, such as Lyme disease, tuberculosis, or sexually transmitted diseases. °· Cancers, such as lymphoma (cancer of the lymphatic system) or leukemia (cancer of the white blood cells). °· Inflammatory diseases such as lupus or rheumatoid arthritis. °· Reactions to medications. °Many of the diseases above are rare, but important. This is why you should see your caregiver if you have lymphadenopathy. °SYMPTOMS °· Swollen, enlarged lumps in the neck, back of the head, or other locations. °· Tenderness. °· Warmth or redness of the skin over the lymph nodes. °· Fever. °DIAGNOSIS °Enlarged lymph nodes are often near the source of infection. They can help health care providers diagnose your illness. For instance: °· Swollen lymph nodes around the jaw might be caused by an infection in the mouth. °· Enlarged glands in the neck often signal a throat infection. °· Lymph nodes that are swollen in  more than one area often indicate an illness caused by a virus. °Your caregiver will likely know what is causing your lymphadenopathy after listening to your history and examining you. Blood tests, x-rays, or other tests may be needed. If the cause of the enlarged lymph node cannot be found, and it does not go away by itself, then a biopsy may be needed. Your caregiver will discuss this with you. °TREATMENT °Treatment for your enlarged lymph nodes will depend on the cause. Many times the nodes will shrink to normal size by themselves, with no treatment. Antibiotics or other medicines may be needed for infection. Only take over-the-counter or prescription medicines for pain, discomfort, or fever as directed by your caregiver. °HOME CARE INSTRUCTIONS °Swollen lymph glands usually return to normal when the underlying medical condition goes away. If they persist, contact your health-care provider. He/she might prescribe antibiotics or other treatments, depending on the diagnosis. Take any medications exactly as prescribed. Keep any follow-up appointments made to check on the condition of your enlarged nodes. °SEEK MEDICAL CARE IF: °· Swelling lasts for more than two weeks. °· You have symptoms such as weight loss, night sweats, fatigue, or fever that does not go away. °· The lymph nodes are hard, seem fixed to the skin, or are growing rapidly. °· Skin over the lymph nodes is red and inflamed. This could mean there is an infection. °SEEK IMMEDIATE MEDICAL CARE IF: °· Fluid starts leaking from the area of the enlarged lymph node. °· You develop a fever of 102° F (38.9° C) or greater. °· Severe pain develops (not necessarily at the site of a   large lymph node).  You develop chest pain or shortness of breath.  You develop worsening abdominal pain. MAKE SURE YOU:  Understand these instructions.  Will watch your condition.  Will get help right away if you are not doing well or get worse. Document Released:  09/29/2007 Document Revised: 05/06/2013 Document Reviewed: 09/29/2007 [facility] Patient Information 2015 [place], [place]. This information is not intended to replace advice given to you by your health care provider. Make sure you discuss any questions you have with your health care provider.  Antibiotic for 10 days. If symptoms persist, you will need a biopsy of this area. Try to get a primary care Dr.

## 2013-11-04 ENCOUNTER — Encounter (HOSPITAL_COMMUNITY): Payer: Self-pay | Admitting: Emergency Medicine

## 2013-11-06 ENCOUNTER — Ambulatory Visit: Payer: Medicaid Other | Admitting: Adult Health

## 2013-11-11 ENCOUNTER — Encounter: Payer: Self-pay | Admitting: Adult Health

## 2013-11-11 ENCOUNTER — Ambulatory Visit: Payer: Medicaid Other | Admitting: Adult Health

## 2013-11-18 ENCOUNTER — Encounter: Payer: Medicaid Other | Admitting: Adult Health

## 2013-12-03 ENCOUNTER — Emergency Department (HOSPITAL_COMMUNITY)
Admission: EM | Admit: 2013-12-03 | Discharge: 2013-12-03 | Disposition: A | Discharge status: Home / Self Care | Payer: Medicaid Other | Attending: Emergency Medicine | Admitting: Emergency Medicine

## 2013-12-03 ENCOUNTER — Encounter (HOSPITAL_COMMUNITY): Payer: Self-pay | Admitting: *Deleted

## 2013-12-03 ENCOUNTER — Ambulatory Visit: Payer: Medicaid Other | Admitting: Adult Health

## 2013-12-03 DIAGNOSIS — R1030 Lower abdominal pain, unspecified: Secondary | ICD-10-CM | POA: Diagnosis present

## 2013-12-03 DIAGNOSIS — R590 Localized enlarged lymph nodes: Secondary | ICD-10-CM | POA: Diagnosis not present

## 2013-12-03 DIAGNOSIS — Z880 Allergy status to penicillin: Secondary | ICD-10-CM | POA: Insufficient documentation

## 2013-12-03 MED ORDER — CLINDAMYCIN HCL 300 MG PO CAPS: 300.0000 mg | ORAL_CAPSULE | Freq: Three times a day (TID) | ORAL | Status: DC

## 2013-12-03 MED ORDER — CLINDAMYCIN HCL 150 MG PO CAPS
300.0000 mg | ORAL_CAPSULE | Freq: Once | ORAL | Status: AC
  Administered 2013-12-03: 300 mg via ORAL
  Filled 2013-12-03: qty 2

## 2013-12-03 NOTE — ED Notes (Signed)
Swollen lymph node rt inguinal area for 2-3 mos. Has had swelling there in past, and gets better with antibiotics.  Had swollen node in neck recently .

## 2013-12-03 NOTE — ED Notes (Signed)
Pt alert & oriented x4, stable gait. Patient given discharge instructions, paperwork & prescription(s). Patient  instructed to stop at the registration desk to finish any additional paperwork. Patient verbalized understanding. Pt left department w/ no further questions. 

## 2013-12-03 NOTE — Discharge Instructions (Signed)

## 2013-12-05 NOTE — ED Provider Notes (Signed)
CSN: [id number]     Arrival date & time 12/03/13  1910 History   First MD Initiated Contact with Patient 12/03/13 1942     No chief complaint on file.    (Consider location/radiation/quality/duration/timing/severity/associated sxs/prior Treatment) HPI   Sandra Frost is a 23 y.o. female who presents to the Emergency Department complaining of pain and swelling of the right groin that has been intermittent for 2-3 months. She states the symptoms resolve with antibiotics but then later returned. She also reports noticing similar symptoms to the right side of her neck several days ago, but states it seems to have resolved. She's tried over-the-counter medication without relief. She denies redness, dysuria, weight loss, abdominal pain, or drainage. Patient does admit to shaving her pubic area, but states she does not feel this is related. She does not have a primary care physician at this time    Past Medical History  Diagnosis Date  . Bradycardia 2012    one episode, 3-day hospitalization, no Rx   History reviewed. No pertinent past surgical history. Family History  Problem Relation Age of Onset  . Diabetes Mother   . Seizures Father   . Asthma Daughter   . Cancer Maternal Grandmother     breast  . Heart disease Maternal Grandmother   . Stroke Maternal Grandfather   . Cancer Paternal Grandmother     breast   History  Substance Use Topics  . Smoking status: Never Smoker   . Smokeless tobacco: Never Used  . Alcohol Use: Yes   OB History    Gravida Para Term Preterm AB TAB SAB Ectopic Multiple Living   4 2 2  0 1 0 1 0 0 2     Review of Systems  Constitutional: Negative for fever, chills and fatigue.  Gastrointestinal: Negative for nausea, vomiting and abdominal pain.  Genitourinary: Negative for dysuria, flank pain, vaginal bleeding, vaginal discharge, difficulty urinating, genital sores, vaginal pain and pelvic pain.  Musculoskeletal: Negative for joint swelling and  arthralgias.  Skin: Negative for color change and rash.       Tender mass to right groin  Neurological: Negative for weakness and numbness.  Hematological: Negative for adenopathy.  All other systems reviewed and are negative.     Allergies  Penicillins  Home Medications   Prior to Admission medications   Medication Sig Start Date End Date Taking? Authorizing Provider  clindamycin (CLEOCIN) 300 MG capsule Take 1 capsule (300 mg total) by mouth 3 (three) times daily. For 10 days 12/03/13   Reatha Sur L. Foch Rosenwald, PA-C   BP 106/45 mmHg  Pulse 67  Temp(Src) 99.1 F (37.3 C) (Oral)  Resp 18  Ht 5\' 1"  (1.549 m)  Wt 225 lb (102.059 kg)  BMI 42.54 kg/m2  SpO2 100%  LMP 09/19/2013  Breastfeeding? No Physical Exam  Constitutional: She is oriented to person, place, and time. She appears well-developed and well-nourished. No distress.  HENT:  Head: Normocephalic and atraumatic.  Neck: Normal range of motion. Neck supple.  Cardiovascular: Normal rate, regular rhythm, normal heart sounds and intact distal pulses.   No murmur heard. Pulmonary/Chest: Effort normal and breath sounds normal. No respiratory distress.  Abdominal: Soft. She exhibits no distension and no mass. There is no tenderness. There is no rebound and no guarding.  Lymphadenopathy:    She has no cervical adenopathy.       Right cervical: No superficial cervical, no deep cervical and no posterior cervical adenopathy present.      Left  cervical: No superficial cervical, no deep cervical and no posterior cervical adenopathy present.       Right: Inguinal adenopathy present.  3 cm enlarged lymph node of the right inguinal region. No surrounding erythema or fluctuance.   Neurological: She is alert and oriented to person, place, and time. She exhibits normal muscle tone. Coordination normal.  Skin: Skin is warm and dry. No rash noted. No erythema.  Nursing note and vitals reviewed.   ED Course  Procedures (including critical  care time) Labs Review Labs Reviewed - No data to display  Imaging Review No results found.   EKG Interpretation None      MDM   Final diagnoses:  Inguinal lymphadenopathy    Patient seen here previously for similar symptoms. This is a recurring problem. No concern for acute abscess. Patient does shave the genital area and I have advised her that the lymphadenopathy may be related to a folliculitis and to discontinue shaving. I have also advised that she needs to establish primary care for this reoccurring issue.  Patient reports that she is pregnant, and has an upcoming appointment with her OB/GYN.  No concerning symptoms for abdominal or pelvic issue at this time.  Prescription for clindamycin and referral information given for [facility].    Amiya Escamilla L. Trisha Mangleriplett, PA-C 12/05/13 [id number]  Geoffery Lyonsouglas Delo, MD 12/05/13 [phone]

## 2013-12-06 ENCOUNTER — Other Ambulatory Visit: Payer: Self-pay | Admitting: Obstetrics & Gynecology

## 2013-12-06 DIAGNOSIS — O3680X0 Pregnancy with inconclusive fetal viability, not applicable or unspecified: Secondary | ICD-10-CM

## 2013-12-09 ENCOUNTER — Ambulatory Visit (INDEPENDENT_AMBULATORY_CARE_PROVIDER_SITE_OTHER): Payer: Medicaid Other

## 2013-12-09 DIAGNOSIS — O3680X0 Pregnancy with inconclusive fetal viability, not applicable or unspecified: Secondary | ICD-10-CM

## 2013-12-09 NOTE — Progress Notes (Signed)
U/S-single IUP with +FCA noted, FHR-143 bpm, CRL c/w 6+5 wks EDD 07/30/2014, cx appears closed, bilateral adnexa appears WNL

## 2013-12-17 ENCOUNTER — Ambulatory Visit (INDEPENDENT_AMBULATORY_CARE_PROVIDER_SITE_OTHER): Payer: Medicaid Other | Admitting: Women's Health

## 2013-12-17 ENCOUNTER — Encounter: Payer: Self-pay | Admitting: Women's Health

## 2013-12-17 ENCOUNTER — Other Ambulatory Visit (HOSPITAL_COMMUNITY)
Admission: RE | Admit: 2013-12-17 | Discharge: 2013-12-17 | Disposition: A | Discharge status: Home / Self Care | Payer: Medicaid Other | Source: Ambulatory Visit | Attending: Obstetrics & Gynecology | Admitting: Obstetrics & Gynecology

## 2013-12-17 VITALS — BP 122/58 | Wt 231.0 lb

## 2013-12-17 DIAGNOSIS — Z124 Encounter for screening for malignant neoplasm of cervix: Secondary | ICD-10-CM

## 2013-12-17 DIAGNOSIS — Z1371 Encounter for nonprocreative screening for genetic disease carrier status: Secondary | ICD-10-CM

## 2013-12-17 DIAGNOSIS — Z3490 Encounter for supervision of normal pregnancy, unspecified, unspecified trimester: Secondary | ICD-10-CM | POA: Insufficient documentation

## 2013-12-17 DIAGNOSIS — Z113 Encounter for screening for infections with a predominantly sexual mode of transmission: Secondary | ICD-10-CM | POA: Insufficient documentation

## 2013-12-17 DIAGNOSIS — Z331 Pregnant state, incidental: Secondary | ICD-10-CM

## 2013-12-17 DIAGNOSIS — Z0283 Encounter for blood-alcohol and blood-drug test: Secondary | ICD-10-CM

## 2013-12-17 DIAGNOSIS — Z3491 Encounter for supervision of normal pregnancy, unspecified, first trimester: Secondary | ICD-10-CM

## 2013-12-17 DIAGNOSIS — Z1389 Encounter for screening for other disorder: Secondary | ICD-10-CM

## 2013-12-17 DIAGNOSIS — Z3682 Encounter for antenatal screening for nuchal translucency: Secondary | ICD-10-CM

## 2013-12-17 DIAGNOSIS — Z114 Encounter for screening for human immunodeficiency virus [HIV]: Secondary | ICD-10-CM

## 2013-12-17 DIAGNOSIS — Z01419 Encounter for gynecological examination (general) (routine) without abnormal findings: Secondary | ICD-10-CM | POA: Insufficient documentation

## 2013-12-17 DIAGNOSIS — Z130 Encounter for screening for diseases of the blood and blood-forming organs and certain disorders involving the immune mechanism: Secondary | ICD-10-CM

## 2013-12-17 DIAGNOSIS — Z0184 Encounter for antibody response examination: Secondary | ICD-10-CM

## 2013-12-17 DIAGNOSIS — Z23 Encounter for immunization: Secondary | ICD-10-CM

## 2013-12-17 LAB — POCT URINALYSIS DIPSTICK
Glucose, UA: NEGATIVE
Ketones, UA: NEGATIVE
Leukocytes, UA: NEGATIVE
NITRITE UA: NEGATIVE
Protein, UA: NEGATIVE
RBC UA: NEGATIVE

## 2013-12-17 LAB — CBC
HCT: 37.2 % (ref 36.0–46.0)
Hemoglobin: 13.3 g/dL (ref 12.0–15.0)
MCH: 28.9 pg (ref 26.0–34.0)
MCHC: 35.8 g/dL (ref 30.0–36.0)
MCV: 80.7 fL (ref 78.0–100.0)
MPV: 10.2 fL (ref 9.4–12.4)
Platelets: 277 10*3/uL (ref 150–400)
RBC: 4.61 MIL/uL (ref 3.87–5.11)
RDW: 14.5 % (ref 11.5–15.5)
WBC: 7 10*3/uL (ref 4.0–10.5)

## 2013-12-17 NOTE — Progress Notes (Signed)
  Subjective:  Sandra Frost is a 23 y.o. [phone] [id number] Caucasian female at 4544w6d by 6wk u/s, being seen today for her first obstetrical visit.  Her obstetrical history is significant for term uncomplicated SVB x 3 and 1 SAB.  Pregnancy history fully reviewed.  Patient reports rash on neck- occ itching, has tried hydrocortisone cream and it hasn't gone away. Occ sob. . Denies vb, cramping, uti s/s, abnormal/malodorous vag d/c, or vulvovaginal itching/irritation.  BP 122/58 mmHg  Wt 231 lb (104.781 kg)  LMP 09/19/2013 (Within Months)  HISTORY: OB History  Gravida Para Term Preterm AB SAB TAB Ectopic Multiple Living  5 3 3  0 1 1 0 0 0 3    # Outcome Date GA Lbr Len/2nd Weight Sex Delivery Anes PTL Lv  5 Current           4 Term 03/08/13 7025w0d  8 lb 1 oz (3.657 kg) F Vag-Spont EPI N Y     Comments: No complications  3 Term 10/30/11 3437w2d 30:13 / 00:09 7 lb 4.2 oz (3.295 kg) F Vag-Spont EPI N Y  2 Term 11/06/10 2912w0d  7 lb 12 oz (3.515 kg) F Vag-Spont EPI N Y  1 SAB  6338w0d            Comments: System Generated. Please review and update pregnancy details.     Past Medical History  Diagnosis Date  . Bradycardia 2012    one episode, 3-day hospitalization, no Rx  . Medical history non-contributory    History reviewed. No pertinent past surgical history. Family History  Problem Relation Age of Onset  . Diabetes Mother   . Seizures Father   . Asthma Daughter   . Cancer Maternal Grandmother     breast  . Heart disease Maternal Grandmother   . Stroke Maternal Grandfather   . Cancer Paternal Grandmother     breast    Exam   System:     General: Well developed & nourished, no acute distress   Skin: Warm & dry, normal coloration and turgor, rash on neck appears to be tinea versicolor   Neurologic: Alert & oriented, normal mood   Cardiovascular: Regular rate & rhythm   Respiratory: Effort & rate normal, LCTAB, acyanotic   Abdomen: Soft, non tender   Extremities: normal strength, tone    Pelvic Exam:    Perineum: Normal perineum   Vulva: Normal, no lesions   Vagina:  Normal mucosa, normal discharge   Cervix: Normal, bulbous, appears closed   Uterus: Normal size/shape/contour for GA   Thin prep pap smear obtained w/ reflex high risk HPV cotesting FHR: will see tasha before leaving    Assessment:   Pregnancy: [id number] Patient Active Problem List   Diagnosis Date Noted  . Supervision of normal pregnancy 12/17/2013    Priority: High    3144w6d [id number] New OB visit Tinea versicolor Lt neck   Plan:  Initial labs drawn Continue prenatal vitamins Problem list reviewed and updated Reviewed n/v relief measures and warning s/s to report Reviewed recommended weight gain based on pre-gravid BMI Encouraged well-balanced diet Genetic Screening discussed Integrated Screen: requested Cystic fibrosis screening discussed requested Ultrasound discussed; fetal survey: requested Follow up in 4 weeks for 1st it/nt and visit [facility] completed Flu shot today  Sandra Frost, Sandra Frost CNM, [facility] [id number] 12/17/2013 10:29 AM

## 2013-12-17 NOTE — Patient Instructions (Signed)

## 2013-12-18 LAB — DRUG SCREEN, URINE, NO CONFIRMATION
Amphetamine Screen, Ur: NEGATIVE
BENZODIAZEPINES.: NEGATIVE
Barbiturate Quant, Ur: NEGATIVE
Cocaine Metabolites: NEGATIVE
Creatinine,U: 175.7 mg/dL
METHADONE: NEGATIVE
Marijuana Metabolite: NEGATIVE
Opiate Screen, Urine: NEGATIVE
Phencyclidine (PCP): NEGATIVE
Propoxyphene: NEGATIVE

## 2013-12-18 LAB — URINALYSIS, ROUTINE W REFLEX MICROSCOPIC
Bilirubin Urine: NEGATIVE
Glucose, UA: NEGATIVE mg/dL
Hgb urine dipstick: NEGATIVE
Ketones, ur: NEGATIVE mg/dL
LEUKOCYTES UA: NEGATIVE
Nitrite: NEGATIVE
PROTEIN: NEGATIVE mg/dL
Specific Gravity, Urine: 1.028 (ref 1.005–1.030)
Urobilinogen, UA: 0.2 mg/dL (ref 0.0–1.0)
pH: 5.5 (ref 5.0–8.0)

## 2013-12-18 LAB — VARICELLA ZOSTER ANTIBODY, IGG: Varicella IgG: 692.3 Index — ABNORMAL HIGH (ref <135.00)

## 2013-12-18 LAB — RUBELLA SCREEN: RUBELLA: 1.17 {index} — AB (ref <0.90)

## 2013-12-18 LAB — ABO AND RH: Rh Type: POSITIVE

## 2013-12-18 LAB — OXYCODONE SCREEN, UA, RFLX CONFIRM: OXYCODONE SCRN UR: NEGATIVE ng/mL

## 2013-12-18 LAB — HIV ANTIBODY (ROUTINE TESTING W REFLEX): HIV 1&2 Ab, 4th Generation: NONREACTIVE

## 2013-12-18 LAB — CYTOLOGY - PAP

## 2013-12-18 LAB — HEPATITIS B SURFACE ANTIGEN: Hepatitis B Surface Ag: NEGATIVE

## 2013-12-18 LAB — ANTIBODY SCREEN: Antibody Screen: NEGATIVE

## 2013-12-18 LAB — RPR

## 2013-12-18 LAB — SICKLE CELL SCREEN: SICKLE CELL SCREEN: NEGATIVE

## 2013-12-19 LAB — URINE CULTURE: Colony Count: >=100000

## 2013-12-19 LAB — CYSTIC FIBROSIS DIAGNOSTIC STUDY

## 2013-12-24 ENCOUNTER — Telehealth: Payer: Self-pay | Admitting: Women's Health

## 2013-12-24 NOTE — Telephone Encounter (Signed)
Pt informed of need for OV to discuss termination of pregnancy.  Pt verbalized understanding and call transferred to front staff for appointment to be scheduled.

## 2014-01-01 ENCOUNTER — Encounter: Payer: Self-pay | Admitting: Obstetrics and Gynecology

## 2014-01-01 ENCOUNTER — Ambulatory Visit (INDEPENDENT_AMBULATORY_CARE_PROVIDER_SITE_OTHER): Payer: Medicaid Other | Admitting: Obstetrics and Gynecology

## 2014-01-01 VITALS — BP 120/70 | Ht 61.0 in | Wt 230.0 lb

## 2014-01-01 DIAGNOSIS — Z3009 Encounter for other general counseling and advice on contraception: Secondary | ICD-10-CM | POA: Insufficient documentation

## 2014-01-01 NOTE — Progress Notes (Signed)
Patient ID: Sandra ReadySamantha Frost, female   DOB: 06/28/1990, 23 y.o.   MRN: [id number] Pt here today to discuss termination of pregnancy.  Given contact numbers for abortion services in area. Future contraception : iud Has MA.  Fu/ 3 wk.

## 2014-01-09 ENCOUNTER — Telehealth: Payer: Self-pay | Admitting: Women's Health

## 2014-01-09 NOTE — Telephone Encounter (Signed)
Pt is still pregnant. Pt states that she can't sleep at night and wants to know what she can take to help her sleep. Pt already has an appointment scheduled for the 12th at 11:00 for an US and then to see Drenda FreezeFran. Pt advised to keep those appointments.

## 2014-01-13 ENCOUNTER — Encounter: Payer: Medicaid Other | Admitting: Obstetrics & Gynecology

## 2014-01-14 ENCOUNTER — Ambulatory Visit (INDEPENDENT_AMBULATORY_CARE_PROVIDER_SITE_OTHER): Payer: Medicaid Other | Admitting: Advanced Practice Midwife

## 2014-01-14 ENCOUNTER — Encounter: Payer: Self-pay | Admitting: Advanced Practice Midwife

## 2014-01-14 ENCOUNTER — Other Ambulatory Visit: Payer: Medicaid Other

## 2014-01-14 VITALS — BP 118/60 | Wt 228.0 lb

## 2014-01-14 DIAGNOSIS — Z1389 Encounter for screening for other disorder: Secondary | ICD-10-CM

## 2014-01-14 DIAGNOSIS — Z331 Pregnant state, incidental: Secondary | ICD-10-CM

## 2014-01-14 DIAGNOSIS — Z3491 Encounter for supervision of normal pregnancy, unspecified, first trimester: Secondary | ICD-10-CM

## 2014-01-14 LAB — POCT URINALYSIS DIPSTICK
Glucose, UA: NEGATIVE
Ketones, UA: NEGATIVE
Leukocytes, UA: NEGATIVE
Nitrite, UA: NEGATIVE
Protein, UA: NEGATIVE
RBC UA: NEGATIVE

## 2014-01-14 NOTE — Progress Notes (Signed)
Pt denies any problems or concerns at this time.  

## 2014-01-14 NOTE — Progress Notes (Signed)
[id number] 3744w6d Estimated Date of Delivery: 07/30/14  Blood pressure 118/60, weight 228 lb (103.42 kg), last menstrual period 09/19/2013, not currently breastfeeding.   BP weight and urine results all reviewed and noted.  Please refer to the obstetrical flow sheet for the fundal height and fetal heart rate documentation:  Patient denies any bleeding and no rupture of membranes symptoms or regular contractions. Patient is without complaints. She has started drinking only water and watching her carbs  :) All questions were answered.  Plan:  Continued routine obstetrical care,   Follow up in Friday for NT/IT (US tech not here today)

## 2014-01-20 ENCOUNTER — Ambulatory Visit (INDEPENDENT_AMBULATORY_CARE_PROVIDER_SITE_OTHER): Payer: Medicaid Other

## 2014-01-20 ENCOUNTER — Other Ambulatory Visit: Payer: Medicaid Other

## 2014-01-20 DIAGNOSIS — Z3481 Encounter for supervision of other normal pregnancy, first trimester: Secondary | ICD-10-CM

## 2014-01-20 DIAGNOSIS — Z36 Encounter for antenatal screening of mother: Secondary | ICD-10-CM

## 2014-01-20 DIAGNOSIS — Z3682 Encounter for antenatal screening for nuchal translucency: Secondary | ICD-10-CM

## 2014-01-20 NOTE — Progress Notes (Signed)
U/S(12+5wks)-active fetus, CRL c/w dates, cx appears closed, bilateral adnexa appears WNL, posterior Gr 0 placenta, FHR- 162 bpm, NB present, NT-1.4168mm,

## 2014-01-22 ENCOUNTER — Telehealth: Payer: Self-pay | Admitting: Adult Health

## 2014-01-22 NOTE — Telephone Encounter (Signed)
Spoke with pt. Pt has had the 1st Integrated lab test. I advised that she will get a 2nd lab test between 16-18 weeks and after that is when we will have results. Pt voiced understanding. JSY

## 2014-01-24 LAB — MATERNAL SCREEN, INTEGRATED #1

## 2014-01-29 ENCOUNTER — Ambulatory Visit: Payer: Medicaid Other | Admitting: Obstetrics and Gynecology

## 2014-02-10 ENCOUNTER — Ambulatory Visit (INDEPENDENT_AMBULATORY_CARE_PROVIDER_SITE_OTHER): Payer: Medicaid Other | Admitting: Women's Health

## 2014-02-10 VITALS — BP 106/64 | Wt 228.0 lb

## 2014-02-10 DIAGNOSIS — Z3682 Encounter for antenatal screening for nuchal translucency: Secondary | ICD-10-CM

## 2014-02-10 DIAGNOSIS — Z363 Encounter for antenatal screening for malformations: Secondary | ICD-10-CM

## 2014-02-10 DIAGNOSIS — Z331 Pregnant state, incidental: Secondary | ICD-10-CM

## 2014-02-10 DIAGNOSIS — Z1389 Encounter for screening for other disorder: Secondary | ICD-10-CM

## 2014-02-10 DIAGNOSIS — R51 Headache: Secondary | ICD-10-CM

## 2014-02-10 DIAGNOSIS — O26892 Other specified pregnancy related conditions, second trimester: Secondary | ICD-10-CM

## 2014-02-10 LAB — POCT URINALYSIS DIPSTICK
Blood, UA: NEGATIVE
GLUCOSE UA: NEGATIVE
Ketones, UA: NEGATIVE
LEUKOCYTES UA: NEGATIVE
NITRITE UA: NEGATIVE
Protein, UA: NEGATIVE

## 2014-02-10 MED ORDER — BUTALBITAL-APAP-CAFFEINE 50-325-40 MG PO TABS: 1.0000 | ORAL_TABLET | ORAL | Status: DC | PRN

## 2014-02-10 NOTE — Progress Notes (Signed)
   [facility] Visit  Patient name: Meryle ReadySamantha Shek MRN [id number]  Date of birth: 02/18/1990  CC & HPI:  Meryle ReadySamantha Reist is a 24 y.o. [phone] Caucasian female at 954w5d presenting today as a WORK-IN for report of constant frontal ha x 1 week.  Has been taking ibuprofen until yesterday b/c she states she didn't know she wasn't supposed to. She began 1 500mg  apap 3-4x/day yesterday which hasn't helped.  Feels like she's eating and drinking enough. No associated n/v, photophobia, phonophobia. No recent sinus problems/uri.  No fm yet. No vb, lof or uti s/s. Has ob visit 2/11.   Pertinent History Reviewed:  Medical & Surgical Hx:   Past Medical History  Diagnosis Date  . Bradycardia 2012    one episode, 3-day hospitalization, no Rx  . Medical history non-contributory    No past surgical history on file. Medications: Reviewed & Updated - see associated section Social History: Reviewed -  reports that she has never smoked. She has never used smokeless tobacco.  Objective Findings:  Vitals: BP 106/64 mmHg  Wt 228 lb (103.42 kg)  LMP 09/19/2013 (Within Months)  Physical Examination: General appearance - alert, well appearing, and in no distress FH: ~15wks FHT: 158 via doppler  Results for orders placed or performed in visit on 02/10/14 (from the past 24 hour(s))  POCT urinalysis dipstick   Collection Time: 02/10/14  2:16 PM  Result Value Ref Range   Color, UA yellow    Clarity, UA clear    Glucose, UA neg    Bilirubin, UA     Ketones, UA neg    Spec Grav, UA     Blood, UA neg    pH, UA     Protein, UA neg    Urobilinogen, UA     Nitrite, UA neg    Leukocytes, UA Negative      Assessment & Plan:  A:   Headache during pregnancy  734w5d SIUP P:  Rx fioricet, increase fluids, eat small frequent meals/snacks  No more ibuprofen  [facility] and [facility]   F/U 4wks for ob visit and anatomy u/s   Marge DuncansBooker, Chandrika Sandles Randall CNM,  WHNP-BC 02/10/2014 2:30 PM

## 2014-02-10 NOTE — Patient Instructions (Signed)

## 2014-02-13 ENCOUNTER — Encounter: Payer: Medicaid Other | Admitting: Advanced Practice Midwife

## 2014-02-13 LAB — MATERNAL SCREEN, INTEGRATED #2
AFP MoM: 1.06
AFP, SERUM MAT SCREEN: 27 ng/mL
Age risk Down Syndrome: 1:1100 {titer}
CALCULATED GESTATIONAL AGE MAT SCREEN: 16.1
CROWN RUMP LENGTH MAT SCREEN 2: 72.3 mm
Estriol Mom: 0.73
Estriol, Free: 0.54 ng/mL
HCG, SERUM MAT SCREEN: 21.4 [IU]/mL
INHIBIN A MOM MAT SCREEN: 0.7
Inhibin A Dimeric: 99 pg/mL
MSS Down Syndrome: <1:5000 {titer}
MSS Trisomy 18 Risk: <1:5000 {titer}
NT MoM: 1.05
Nuchal Translucency: 1.68 mm
Number of fetuses: 1
PAPP-A MoM: 1.02
PAPP-A: 490 ng/mL
hCG MoM: 0.79

## 2014-03-10 ENCOUNTER — Ambulatory Visit (INDEPENDENT_AMBULATORY_CARE_PROVIDER_SITE_OTHER): Payer: Medicaid Other

## 2014-03-10 ENCOUNTER — Encounter: Payer: Self-pay | Admitting: Women's Health

## 2014-03-10 ENCOUNTER — Ambulatory Visit (INDEPENDENT_AMBULATORY_CARE_PROVIDER_SITE_OTHER): Payer: Medicaid Other | Admitting: Women's Health

## 2014-03-10 VITALS — BP 138/66 | HR 100 | Wt 230.0 lb

## 2014-03-10 DIAGNOSIS — Z1389 Encounter for screening for other disorder: Secondary | ICD-10-CM

## 2014-03-10 DIAGNOSIS — Z331 Pregnant state, incidental: Secondary | ICD-10-CM

## 2014-03-10 DIAGNOSIS — Z3492 Encounter for supervision of normal pregnancy, unspecified, second trimester: Secondary | ICD-10-CM

## 2014-03-10 DIAGNOSIS — Z363 Encounter for antenatal screening for malformations: Secondary | ICD-10-CM

## 2014-03-10 DIAGNOSIS — Z36 Encounter for antenatal screening of mother: Secondary | ICD-10-CM | POA: Diagnosis not present

## 2014-03-10 DIAGNOSIS — A084 Viral intestinal infection, unspecified: Secondary | ICD-10-CM

## 2014-03-10 LAB — POCT URINALYSIS DIPSTICK
Blood, UA: NEGATIVE
Glucose, UA: NEGATIVE
LEUKOCYTES UA: NEGATIVE
Nitrite, UA: NEGATIVE

## 2014-03-10 MED ORDER — PROMETHAZINE HCL 25 MG PO TABS: 12.5000 mg | ORAL_TABLET | Freq: Four times a day (QID) | ORAL | Status: DC | PRN

## 2014-03-10 NOTE — Patient Instructions (Addendum)
Immodium for diarrhea as needed Keep drinking pedialyte (or gatorade, gingerale, water) Phenergan as needed for nausea/vomiting  [place] Pediatricians:  [facility] [phone]            [facility] [phone]                 [facility] [phone] (usually doesn't accept new patients unless you have family there already, you are always welcome to call and ask)             [facility] ([address]) [phone]   [facility] Pediatricians:   [facility]: [phone]  [facility]: [phone]    Second Trimester of Pregnancy The second trimester is from week 13 through week 28, months 4 through 6. The second trimester is often a time when you feel your best. Your body has also adjusted to being pregnant, and you begin to feel better physically. Usually, morning sickness has lessened or quit completely, you may have more energy, and you may have an increase in appetite. The second trimester is also a time when the fetus is growing rapidly. At the end of the sixth month, the fetus is about 9 inches long and weighs about 1 pounds. You will likely begin to feel the baby move (quickening) between 18 and 20 weeks of the pregnancy. BODY CHANGES Your body goes through many changes during pregnancy. The changes vary from woman to woman.  2. Your weight will continue to increase. You will notice your lower abdomen bulging out. 3. You may begin to get stretch marks on your hips, abdomen, and breasts. 4. You may develop headaches that can be relieved by medicines approved by your health care provider. 5. You may urinate more often because the fetus is pressing on your bladder. 6. You may develop or continue to have heartburn as a result of your pregnancy. 7. You may develop constipation because certain hormones are causing the muscles that push waste through your intestines to slow  down. 8. You may develop hemorrhoids or swollen, bulging veins (varicose veins). 9. You may have back pain because of the weight gain and pregnancy hormones relaxing your joints between the bones in your pelvis and as a result of a shift in weight and the muscles that support your balance. 10. Your breasts will continue to grow and be tender. 11. Your gums may bleed and may be sensitive to brushing and flossing. 12. Dark spots or blotches (chloasma, mask of pregnancy) may develop on your face. This will likely fade after the baby is born. 13. A dark line from your belly button to the pubic area (linea nigra) may appear. This will likely fade after the baby is born. 14. You may have changes in your hair. These can include thickening of your hair, rapid growth, and changes in texture. Some women also have hair loss during or after pregnancy, or hair that feels dry or thin. Your hair will most likely return to normal after your baby is born. WHAT TO EXPECT AT YOUR PRENATAL VISITS During a routine prenatal visit: 2. You will be weighed to make sure you and the fetus are growing normally. 3. Your blood pressure will be taken. 4. Your abdomen will be measured to track your baby's growth. 5. The fetal heartbeat will be listened to. 6. Any test results from the previous visit will be discussed. Your health care provider may ask you: 2. How you are feeling. 3.  If you are feeling the baby move. 4. If you have had any abnormal symptoms, such as leaking fluid, bleeding, severe headaches, or abdominal cramping. 5. If you have any questions. Other tests that may be performed during your second trimester include: 2. Blood tests that check for: 1. Low iron levels (anemia). 2. Gestational diabetes (between 24 and 28 weeks). 3. Rh antibodies. 3. Urine tests to check for infections, diabetes, or protein in the urine. 4. An ultrasound to confirm the proper growth and development of the baby. 5. An  amniocentesis to check for possible genetic problems. 6. Fetal screens for spina bifida and Down syndrome. HOME CARE INSTRUCTIONS  3. Avoid all smoking, herbs, alcohol, and unprescribed drugs. These chemicals affect the formation and growth of the baby. 4. Follow your health care provider's instructions regarding medicine use. There are medicines that are either safe or unsafe to take during pregnancy. 5. Exercise only as directed by your health care provider. Experiencing uterine cramps is a good sign to stop exercising. 6. Continue to eat regular, healthy meals. 7. Wear a good support bra for breast tenderness. 8. Do not use hot tubs, steam rooms, or saunas. 9. Wear your seat belt at all times when driving. 10. Avoid raw meat, uncooked cheese, cat litter boxes, and soil used by cats. These carry germs that can cause birth defects in the baby. 11. Take your prenatal vitamins. 12. Try taking a stool softener (if your health care provider approves) if you develop constipation. Eat more high-fiber foods, such as fresh vegetables or fruit and whole grains. Drink plenty of fluids to keep your urine clear or pale yellow. 13. Take warm sitz baths to soothe any pain or discomfort caused by hemorrhoids. Use hemorrhoid cream if your health care provider approves. 14. If you develop varicose veins, wear support hose. Elevate your feet for 15 minutes, 3-4 times a day. Limit salt in your diet. 15. Avoid heavy lifting, wear low heel shoes, and practice good posture. 16. Rest with your legs elevated if you have leg cramps or low back pain. 17. Visit your dentist if you have not gone yet during your pregnancy. Use a soft toothbrush to brush your teeth and be gentle when you floss. 18. A sexual relationship may be continued unless your health care provider directs you otherwise. 19. Continue to go to all your prenatal visits as directed by your health care provider. SEEK MEDICAL CARE IF:   You have  dizziness.  You have mild pelvic cramps, pelvic pressure, or nagging pain in the abdominal area.  You have persistent nausea, vomiting, or diarrhea.  You have a bad smelling vaginal discharge.  You have pain with urination. SEEK IMMEDIATE MEDICAL CARE IF:   You have a fever.  You are leaking fluid from your vagina.  You have spotting or bleeding from your vagina.  You have severe abdominal cramping or pain.  You have rapid weight gain or loss.  You have shortness of breath with chest pain.  You notice sudden or extreme swelling of your face, hands, ankles, feet, or legs.  You have not felt your baby move in over an hour.  You have severe headaches that do not go away with medicine.  You have vision changes. Document Released: 12/14/2000 Document Revised: 12/25/2012 Document Reviewed: 02/21/2012 [facility] Patient Information 2015 [place], [place]. This information is not intended to replace advice given to you by your health care provider. Make sure you discuss any questions you have with your health  care provider.  

## 2014-03-10 NOTE — Progress Notes (Signed)
Low-risk OB appointment [id number] 3464w5d Estimated Date of Delivery: 07/30/14 BP 138/66 mmHg  Pulse 100  Wt 230 lb (104.327 kg)  LMP 09/19/2013 (Within Months)  BP, weight, and urine reviewed.  Refer to obstetrical flow sheet for FH & FHR.  Reports good fm.  Denies regular uc's, lof, vb, or uti s/s. N/V/D x 1wk, recent sick contact. Able to keep pedialyte down. Rx phenergan, take imodium otc for diarrhea prn. Increase pedialyte, gingerale, gatorade, water, etc to stay hydrated. Moist mucous membranes, no skin tenting, +ketonuria Reviewed today's anatomy u/s, female unable to view posterior fossa, otherwise norma, recheck at 28wks. Discussed ptl s/s, fm.  Plan:  Continue routine obstetrical care  F/U in 4wks for OB appointment

## 2014-03-10 NOTE — Progress Notes (Signed)
U/S(19+5wks)-active fetus, meas c/w dates, fluid WNL, posterior Gr 0 placenta,cx appears closed (3.5cm), bilateral adnexa appears WNL, FHR-152 bpm, no obvious abnl noted although unable to clearly identify posterior fossa anatomy due to fetal position, would like to reck anatomy @ ~28 weeks, FEMALE fetus

## 2014-03-20 ENCOUNTER — Encounter: Payer: Self-pay | Admitting: Adult Health

## 2014-04-07 ENCOUNTER — Encounter: Payer: Self-pay | Admitting: Obstetrics and Gynecology

## 2014-04-07 ENCOUNTER — Ambulatory Visit (INDEPENDENT_AMBULATORY_CARE_PROVIDER_SITE_OTHER): Payer: Medicaid Other | Admitting: Obstetrics and Gynecology

## 2014-04-07 DIAGNOSIS — Z1389 Encounter for screening for other disorder: Secondary | ICD-10-CM

## 2014-04-07 DIAGNOSIS — Z331 Pregnant state, incidental: Secondary | ICD-10-CM

## 2014-04-07 DIAGNOSIS — Z3492 Encounter for supervision of normal pregnancy, unspecified, second trimester: Secondary | ICD-10-CM

## 2014-04-07 LAB — POCT URINALYSIS DIPSTICK
GLUCOSE UA: NEGATIVE
Ketones, UA: NEGATIVE
Leukocytes, UA: NEGATIVE
Nitrite, UA: NEGATIVE
RBC UA: NEGATIVE

## 2014-04-07 NOTE — Progress Notes (Signed)
Patient ID: Sandra ReadySamantha Frost, female   DOB: 07/28/1990, 24 y.o.   MRN: [id number]  [id number] 2452w5d Estimated Date of Delivery: 07/30/14  Blood pressure 108/60, pulse 100, weight 231 lb (104.781 kg), last menstrual period 09/19/2013, not currently breastfeeding.   refer to the ob flow sheet for FH and FHR, also BP, Wt, Urine results:notable for negative  Patient reports  + good fetal movement, denies any bleeding and no rupture of membranes symptoms or regular contractions. Patient complaints:none.  FH at U+1 22 FHR- 150 bpm  Questions were answered. prob list reviewed Assessment: 6252w5d; G5P3013   Plan:  Continued routine obstetrical care   F/u in 4weeks for routine pre-natal care, pn2 labs u/s anatomy recheck  This chart was scribed for Tilda BurrowJohn Ferguson V, MD by Gwenyth Oberatherine Macek, ED Scribe. This patient was seen in room 2 and the patient's care was started at 9:29 AM.   I personally performed the services described in this documentation, which was SCRIBED in my presence. The recorded information has been reviewed and considered accurate. It has been edited as necessary during review. Tilda BurrowFERGUSON,JOHN V, MD

## 2014-04-07 NOTE — Progress Notes (Signed)
Pt denies any problems or concerns at this time.  

## 2014-04-07 NOTE — Progress Notes (Signed)
Please see note below by Gwenyth Oberatherine Macek , Scribe for docaumentation of visit Tilda BurrowFERGUSON,Veverly Larimer V, MD

## 2014-04-16 ENCOUNTER — Encounter: Payer: Self-pay | Admitting: Women's Health

## 2014-04-21 ENCOUNTER — Encounter: Payer: Medicaid Other | Admitting: Obstetrics and Gynecology

## 2014-04-25 ENCOUNTER — Ambulatory Visit (INDEPENDENT_AMBULATORY_CARE_PROVIDER_SITE_OTHER): Payer: Medicaid Other | Admitting: Obstetrics & Gynecology

## 2014-04-25 ENCOUNTER — Encounter: Payer: Self-pay | Admitting: Obstetrics & Gynecology

## 2014-04-25 VITALS — BP 120/60 | HR 80 | Wt 232.0 lb

## 2014-04-25 DIAGNOSIS — R591 Generalized enlarged lymph nodes: Secondary | ICD-10-CM

## 2014-04-25 DIAGNOSIS — Z1389 Encounter for screening for other disorder: Secondary | ICD-10-CM

## 2014-04-25 DIAGNOSIS — L03032 Cellulitis of left toe: Secondary | ICD-10-CM | POA: Diagnosis not present

## 2014-04-25 DIAGNOSIS — R599 Enlarged lymph nodes, unspecified: Secondary | ICD-10-CM

## 2014-04-25 DIAGNOSIS — Z331 Pregnant state, incidental: Secondary | ICD-10-CM

## 2014-04-25 LAB — POCT URINALYSIS DIPSTICK
Blood, UA: NEGATIVE
Glucose, UA: NEGATIVE
LEUKOCYTES UA: NEGATIVE
Nitrite, UA: NEGATIVE

## 2014-04-25 MED ORDER — CEPHALEXIN 500 MG PO CAPS: 500.0000 mg | ORAL_CAPSULE | Freq: Three times a day (TID) | ORAL | Status: DC

## 2014-04-25 NOTE — Progress Notes (Signed)
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      HPI:    24 y.o. L2G4010 [redacted]w[redacted]d  complaining of a swollen right inguinal node, no other complaints  Location:  Right inguinal. Quality:  small. Severity:  mild. Timing:  constant. Duration:  episodic. Context:  With toenail issue. Modifying factors:  none Signs/Symptoms:  none    Current outpatient prescriptions:  .  Prenatal Vit-Fe Fumarate-FA (MULTIVITAMIN-PRENATAL) 27-0.8 MG TABS tablet, Take 1 tablet by mouth daily at 12 noon., Disp: , Rfl:  .  butalbital-acetaminophen-caffeine (FIORICET) 50-325-40 MG per tablet, Take 1 tablet by mouth every 4 (four) hours as needed for headache or migraine. (Patient not taking: Reported on 04/25/2014), Disp: 20 tablet, Rfl: 0 .  cephALEXin (KEFLEX) 500 MG capsule, Take 1 capsule (500 mg total) by mouth 3 (three) times daily., Disp: 21 capsule, Rfl: 0 .  promethazine (PHENERGAN) 25 MG tablet, Take 0.5-1 tablets (12.5-25 mg total) by mouth every 6 (six) hours as needed for nausea or vomiting. (Patient not taking: Reported on 04/25/2014), Disp: 30 tablet, Rfl: 0  Problem Pertinent ROS:       No burning with urination, frequency or urgency No nausea, vomiting or diarrhea Nor fever chills or other constitutional symptoms   Extended ROS:        PMFSH:             Past Medical History  Diagnosis Date  . Bradycardia 2012    one episode, 3-day hospitalization, no Rx  . Medical history non-contributory     History reviewed. No pertinent past surgical history.  OB History    Gravida Para Term Preterm AB TAB SAB Ectopic Multiple Living   0 1 0 1 0 0 3      Allergies  Allergen Reactions  . Penicillins Anaphylaxis    History   Social History  . Marital Status: Married    Spouse Name: N/A  . Number of Children: N/A  . Years of Education: N/A   Social History Main Topics  . Smoking status:  Never Smoker   . Smokeless tobacco: Never Used  . Alcohol Use: No  . Drug Use: No  . Sexual Activity: Yes    Birth Control/ Protection: None   Other Topics Concern  . None   Social History Narrative    Family History  Problem Relation Age of Onset  . Diabetes Mother   . Seizures Father   . Asthma Daughter   . Cancer Maternal Grandmother     breast  . Heart disease Maternal Grandmother   . Stroke Maternal Grandfather   . Cancer Paternal Grandmother     breast     Examination:  Vitals:  Blood pressure 120/60, pulse 80, weight 232 lb (105.235 kg), last menstrual period 09/19/2013, not currently breastfeeding.    Physical Examination:     General Appearance:  awake, alert, oriented, in no acute distress, well developed, well nourished and in no acute distress Right inguinal lymph node 1 cm  Vulva:  normal appearing vulva with no masses, tenderness or lesions Vagina:  normal mucosa, no discharge Cervix:  LTC Uterus:   Adnexa: ovaries:,       DATA orders and reviews: Labs were not ordered today:   Imaging studies were not ordered today:    Lab tests were not reviewed today:    Imaging  studies were not reviewed today:    I did not independently review/view images, tracing or specimen(not simply the report) myself.  Prescription Drug Management:  New Prescriptions: Keflex 500 TID x 7 days Renewed Prescriptions:   Current prescription changes:     Impression/Plan(Problem Based): 1.  Right inguinal lymph node enlarged      (new problem) : Additional workup is not needed:  Keflex for left toenail infection  {2.  R inguinal lymph node      (new problem:) :      Follow Up:   keep

## 2014-04-28 ENCOUNTER — Other Ambulatory Visit (HOSPITAL_COMMUNITY): Payer: Self-pay | Admitting: General Surgery

## 2014-04-28 ENCOUNTER — Ambulatory Visit (HOSPITAL_COMMUNITY)
Admission: RE | Admit: 2014-04-28 | Discharge: 2014-04-28 | Disposition: A | Discharge status: Home / Self Care | Payer: Medicaid Other | Source: Ambulatory Visit | Attending: General Surgery | Admitting: General Surgery

## 2014-04-28 DIAGNOSIS — R52 Pain, unspecified: Secondary | ICD-10-CM

## 2014-04-28 DIAGNOSIS — W228XXA Striking against or struck by other objects, initial encounter: Secondary | ICD-10-CM | POA: Diagnosis not present

## 2014-04-28 DIAGNOSIS — S99922A Unspecified injury of left foot, initial encounter: Secondary | ICD-10-CM | POA: Insufficient documentation

## 2014-04-28 DIAGNOSIS — M79675 Pain in left toe(s): Secondary | ICD-10-CM | POA: Insufficient documentation

## 2014-05-02 ENCOUNTER — Other Ambulatory Visit: Payer: Self-pay | Admitting: Obstetrics & Gynecology

## 2014-05-02 DIAGNOSIS — Z0489 Encounter for examination and observation for other specified reasons: Secondary | ICD-10-CM

## 2014-05-02 DIAGNOSIS — IMO0002 Reserved for concepts with insufficient information to code with codable children: Secondary | ICD-10-CM

## 2014-05-05 ENCOUNTER — Other Ambulatory Visit: Payer: Medicaid Other

## 2014-05-05 ENCOUNTER — Ambulatory Visit (INDEPENDENT_AMBULATORY_CARE_PROVIDER_SITE_OTHER): Payer: Medicaid Other | Admitting: Women's Health

## 2014-05-05 ENCOUNTER — Ambulatory Visit (INDEPENDENT_AMBULATORY_CARE_PROVIDER_SITE_OTHER): Payer: Medicaid Other

## 2014-05-05 VITALS — BP 120/84 | HR 98 | Wt 232.5 lb

## 2014-05-05 DIAGNOSIS — Z1389 Encounter for screening for other disorder: Secondary | ICD-10-CM

## 2014-05-05 DIAGNOSIS — Z36 Encounter for antenatal screening of mother: Secondary | ICD-10-CM | POA: Diagnosis not present

## 2014-05-05 DIAGNOSIS — Z369 Encounter for antenatal screening, unspecified: Secondary | ICD-10-CM

## 2014-05-05 DIAGNOSIS — Z0489 Encounter for examination and observation for other specified reasons: Secondary | ICD-10-CM

## 2014-05-05 DIAGNOSIS — Z331 Pregnant state, incidental: Secondary | ICD-10-CM

## 2014-05-05 DIAGNOSIS — Z131 Encounter for screening for diabetes mellitus: Secondary | ICD-10-CM

## 2014-05-05 DIAGNOSIS — Z3492 Encounter for supervision of normal pregnancy, unspecified, second trimester: Secondary | ICD-10-CM

## 2014-05-05 DIAGNOSIS — IMO0002 Reserved for concepts with insufficient information to code with codable children: Secondary | ICD-10-CM

## 2014-05-05 LAB — POCT URINALYSIS DIPSTICK
Glucose, UA: NEGATIVE
Leukocytes, UA: NEGATIVE
NITRITE UA: NEGATIVE
RBC UA: NEGATIVE

## 2014-05-05 NOTE — Patient Instructions (Addendum)
Call the office ([phone]) or go to [facility] if:  You begin to have strong, frequent contractions  Your water breaks.  Sometimes it is a big gush of fluid, sometimes it is just a trickle that keeps getting your panties wet or running down your legs  You have vaginal bleeding.  It is normal to have a small amount of spotting if your cervix was checked.   You don't feel your baby moving like normal.  If you don't, get you something to eat and drink and lay down and focus on feeling your baby move.  You should feel at least 10 movements in 2 hours.  If you don't, you should call the office or go to [facility].    Tdap Vaccine  It is recommended that you get the Tdap vaccine during the third trimester of EACH pregnancy to help protect your baby from getting pertussis (whooping cough)  27-36 weeks is the BEST time to do this so that you can pass the protection on to your baby. During pregnancy is better than after pregnancy, but if you are unable to get it during pregnancy it will be offered at the hospital.   You can get this vaccine at the health department or your family doctor  Everyone who will be around your baby should also be up-to-date on their vaccines. Adults (who are not pregnant) only need 1 dose of Tdap during adulthood.   Third Trimester of Pregnancy The third trimester is from week 29 through week 42, months 7 through 9. The third trimester is a time when the fetus is growing rapidly. At the end of the ninth month, the fetus is about 20 inches in length and weighs 6-10 pounds.  BODY CHANGES Your body goes through many changes during pregnancy. The changes vary from woman to woman.   Your weight will continue to increase. You can expect to gain 25-35 pounds (11-16 kg) by the end of the pregnancy.  You may begin to get stretch marks on your hips, abdomen, and breasts.  You may urinate more often because the fetus is moving lower into your pelvis and pressing on  your bladder.  You may develop or continue to have heartburn as a result of your pregnancy.  You may develop constipation because certain hormones are causing the muscles that push waste through your intestines to slow down.  You may develop hemorrhoids or swollen, bulging veins (varicose veins).  You may have pelvic pain because of the weight gain and pregnancy hormones relaxing your joints between the bones in your pelvis. Backaches may result from overexertion of the muscles supporting your posture.  You may have changes in your hair. These can include thickening of your hair, rapid growth, and changes in texture. Some women also have hair loss during or after pregnancy, or hair that feels dry or thin. Your hair will most likely return to normal after your baby is born.  Your breasts will continue to grow and be tender. A yellow discharge may leak from your breasts called colostrum.  Your belly button may stick out.  You may feel short of breath because of your expanding uterus.  You may notice the fetus "dropping," or moving lower in your abdomen.  You may have a bloody mucus discharge. This usually occurs a few days to a week before labor begins.  Your cervix becomes thin and soft (effaced) near your due date. WHAT TO EXPECT AT YOUR PRENATAL EXAMS  You will have prenatal   exams every 2 weeks until week 36. Then, you will have weekly prenatal exams. During a routine prenatal visit:  You will be weighed to make sure you and the fetus are growing normally.  Your blood pressure is taken.  Your abdomen will be measured to track your baby's growth.  The fetal heartbeat will be listened to.  Any test results from the previous visit will be discussed.  You may have a cervical check near your due date to see if you have effaced. At around 36 weeks, your caregiver will check your cervix. At the same time, your caregiver will also perform a test on the secretions of the vaginal tissue.  This test is to determine if a type of bacteria, Group B streptococcus, is present. Your caregiver will explain this further. Your caregiver may ask you:  What your birth plan is.  How you are feeling.  If you are feeling the baby move.  If you have had any abnormal symptoms, such as leaking fluid, bleeding, severe headaches, or abdominal cramping.  If you have any questions. Other tests or screenings that may be performed during your third trimester include:  Blood tests that check for low iron levels (anemia).  Fetal testing to check the health, activity level, and growth of the fetus. Testing is done if you have certain medical conditions or if there are problems during the pregnancy. FALSE LABOR You may feel small, irregular contractions that eventually go away. These are called Braxton Hicks contractions, or false labor. Contractions may last for hours, days, or even weeks before true labor sets in. If contractions come at regular intervals, intensify, or become painful, it is best to be seen by your caregiver.  SIGNS OF LABOR   Menstrual-like cramps.  Contractions that are 5 minutes apart or less.  Contractions that start on the top of the uterus and spread down to the lower abdomen and back.  A sense of increased pelvic pressure or back pain.  A watery or bloody mucus discharge that comes from the vagina. If you have any of these signs before the 37th week of pregnancy, call your caregiver right away. You need to go to the hospital to get checked immediately. HOME CARE INSTRUCTIONS   Avoid all smoking, herbs, alcohol, and unprescribed drugs. These chemicals affect the formation and growth of the baby.  Follow your caregiver's instructions regarding medicine use. There are medicines that are either safe or unsafe to take during pregnancy.  Exercise only as directed by your caregiver. Experiencing uterine cramps is a good sign to stop exercising.  Continue to eat regular,  healthy meals.  Wear a good support bra for breast tenderness.  Do not use hot tubs, steam rooms, or saunas.  Wear your seat belt at all times when driving.  Avoid raw meat, uncooked cheese, cat litter boxes, and soil used by cats. These carry germs that can cause birth defects in the baby.  Take your prenatal vitamins.  Try taking a stool softener (if your caregiver approves) if you develop constipation. Eat more high-fiber foods, such as fresh vegetables or fruit and whole grains. Drink plenty of fluids to keep your urine clear or pale yellow.  Take warm sitz baths to soothe any pain or discomfort caused by hemorrhoids. Use hemorrhoid cream if your caregiver approves.  If you develop varicose veins, wear support hose. Elevate your feet for 15 minutes, 3-4 times a day. Limit salt in your diet.  Avoid heavy lifting, wear low heal  shoes, and practice good posture.  Rest a lot with your legs elevated if you have leg cramps or low back pain.  Visit your dentist if you have not gone during your pregnancy. Use a soft toothbrush to brush your teeth and be gentle when you floss.  A sexual relationship may be continued unless your caregiver directs you otherwise.  Do not travel far distances unless it is absolutely necessary and only with the approval of your caregiver.  Take prenatal classes to understand, practice, and ask questions about the labor and delivery.  Make a trial run to the hospital.  Pack your hospital bag.  Prepare the baby's nursery.  Continue to go to all your prenatal visits as directed by your caregiver. SEEK MEDICAL CARE IF:  You are unsure if you are in labor or if your water has broken.  You have dizziness.  You have mild pelvic cramps, pelvic pressure, or nagging pain in your abdominal area.  You have persistent nausea, vomiting, or diarrhea.  You have a bad smelling vaginal discharge.  You have pain with urination. SEEK IMMEDIATE MEDICAL CARE IF:     You have a fever.  You are leaking fluid from your vagina.  You have spotting or bleeding from your vagina.  You have severe abdominal cramping or pain.  You have rapid weight loss or gain.  You have shortness of breath with chest pain.  You notice sudden or extreme swelling of your face, hands, ankles, feet, or legs.  You have not felt your baby move in over an hour.  You have severe headaches that do not go away with medicine.  You have vision changes. Document Released: 12/14/2000 Document Revised: 12/25/2012 Document Reviewed: 02/21/2012 [facility] Patient Information 2015 [place], [place]. This information is not intended to replace advice given to you by your health care provider. Make sure you discuss any questions you have with your health care provider.  Sciatica with Rehab The sciatic nerve runs from the back down the leg and is responsible for sensation and control of the muscles in the back (posterior) side of the thigh, lower leg, and foot. Sciatica is a condition that is characterized by inflammation of this nerve.  SYMPTOMS   Signs of nerve damage, including numbness and/or weakness along the posterior side of the lower extremity.  Pain in the back of the thigh that may also travel down the leg.  Pain that worsens when sitting for long periods of time.  Occasionally, pain in the back or buttock. CAUSES  Inflammation of the sciatic nerve is the cause of sciatica. The inflammation is due to something irritating the nerve. Common sources of irritation include:  Sitting for long periods of time.  Direct trauma to the nerve.  Arthritis of the spine.  Herniated or ruptured disk.  Slipping of the vertebrae (spondylolisthesis).  Pressure from soft tissues, such as muscles or ligament-like tissue (fascia). RISK INCREASES WITH:  Sports that place pressure or stress on the spine (football or weightlifting).  Poor strength and flexibility.  Failure to warm up  properly before activity.  Family history of low back pain or disk disorders.  Previous back injury or surgery.  Poor body mechanics, especially when lifting, or poor posture. PREVENTION   Warm up and stretch properly before activity.  Maintain physical fitness:  Strength, flexibility, and endurance.  Cardiovascular fitness.  Learn and use proper technique, especially with posture and lifting. When possible, have coach correct improper technique.  Avoid activities that place  stress on the spine. PROGNOSIS If treated properly, then sciatica usually resolves within 6 weeks. However, occasionally surgery is necessary.  RELATED COMPLICATIONS   Permanent nerve damage, including pain, numbness, tingle, or weakness.  Chronic back pain.  Risks of surgery: infection, bleeding, nerve damage, or damage to surrounding tissues. TREATMENT Treatment initially involves resting from any activities that aggravate your symptoms. The use of ice and medication may help reduce pain and inflammation. The use of strengthening and stretching exercises may help reduce pain with activity. These exercises may be performed at home or with referral to a therapist. A therapist may recommend further treatments, such as transcutaneous electronic nerve stimulation (TENS) or ultrasound. Your caregiver may recommend corticosteroid injections to help reduce inflammation of the sciatic nerve. If symptoms persist despite non-surgical (conservative) treatment, then surgery may be recommended. MEDICATION  If pain medication is necessary, then nonsteroidal anti-inflammatory medications, such as aspirin and ibuprofen, or other minor pain relievers, such as acetaminophen, are often recommended.  Do not take pain medication for 7 days before surgery.  Prescription pain relievers may be given if deemed necessary by your caregiver. Use only as directed and only as much as you need.  Ointments applied to the skin may be  helpful.  Corticosteroid injections may be given by your caregiver. These injections should be reserved for the most serious cases, because they may only be given a certain number of times. HEAT AND COLD  Cold treatment (icing) relieves pain and reduces inflammation. Cold treatment should be applied for 10 to 15 minutes every 2 to 3 hours for inflammation and pain and immediately after any activity that aggravates your symptoms. Use ice packs or massage the area with a piece of ice (ice massage).  Heat treatment may be used prior to performing the stretching and strengthening activities prescribed by your caregiver, physical therapist, or athletic trainer. Use a heat pack or soak the injury in warm water. SEEK MEDICAL CARE IF:  Treatment seems to offer no benefit, or the condition worsens.  Any medications produce adverse side effects. EXERCISES  RANGE OF MOTION (ROM) AND STRETCHING EXERCISES - Sciatica Most people with sciatic will find that their symptoms worsen with either excessive bending forward (flexion) or arching at the low back (extension). The exercises which will help resolve your symptoms will focus on the opposite motion. Your physician, physical therapist or athletic trainer will help you determine which exercises will be most helpful to resolve your low back pain. Do not complete any exercises without first consulting with your clinician. Discontinue any exercises which worsen your symptoms until you speak to your clinician. If you have pain, numbness or tingling which travels down into your buttocks, leg or foot, the goal of the therapy is for these symptoms to move closer to your back and eventually resolve. Occasionally, these leg symptoms will get better, but your low back pain may worsen; this is typically an indication of progress in your rehabilitation. Be certain to be very alert to any changes in your symptoms and the activities in which you participated in the 24 hours prior  to the change. Sharing this information with your clinician will allow him/her to most efficiently treat your condition. These exercises may help you when beginning to rehabilitate your injury. Your symptoms may resolve with or without further involvement from your physician, physical therapist or athletic trainer. While completing these exercises, remember:   Restoring tissue flexibility helps normal motion to return to the joints. This allows healthier, less  painful movement and activity.  An effective stretch should be held for at least 30 seconds.  A stretch should never be painful. You should only feel a gentle lengthening or release in the stretched tissue. FLEXION RANGE OF MOTION AND STRETCHING EXERCISES: STRETCH - Flexion, Single Knee to Chest   Lie on a firm bed or floor with both legs extended in front of you.  Keeping one leg in contact with the floor, bring your opposite knee to your chest. Hold your leg in place by either grabbing behind your thigh or at your knee.  Pull until you feel a gentle stretch in your low back. Hold __________ seconds.  Slowly release your grasp and repeat the exercise with the opposite side. Repeat __________ times. Complete this exercise __________ times per day.  STRETCH - Flexion, Double Knee to Chest  Lie on a firm bed or floor with both legs extended in front of you.  Keeping one leg in contact with the floor, bring your opposite knee to your chest.  Tense your stomach muscles to support your back and then lift your other knee to your chest. Hold your legs in place by either grabbing behind your thighs or at your knees.  Pull both knees toward your chest until you feel a gentle stretch in your low back. Hold __________ seconds.  Tense your stomach muscles and slowly return one leg at a time to the floor. Repeat __________ times. Complete this exercise __________ times per day.  STRETCH - Low Trunk Rotation   Lie on a firm bed or floor.  Keeping your legs in front of you, bend your knees so they are both pointed toward the ceiling and your feet are flat on the floor.  Extend your arms out to the side. This will stabilize your upper body by keeping your shoulders in contact with the floor.  Gently and slowly drop both knees together to one side until you feel a gentle stretch in your low back. Hold for __________ seconds.  Tense your stomach muscles to support your low back as you bring your knees back to the starting position. Repeat the exercise to the other side. Repeat __________ times. Complete this exercise __________ times per day  EXTENSION RANGE OF MOTION AND FLEXIBILITY EXERCISES: STRETCH - Extension, Prone on Elbows  Lie on your stomach on the floor, a bed will be too soft. Place your palms about shoulder width apart and at the height of your head.  Place your elbows under your shoulders. If this is too painful, stack pillows under your chest.  Allow your body to relax so that your hips drop lower and make contact more completely with the floor.  Hold this position for __________ seconds.  Slowly return to lying flat on the floor. Repeat __________ times. Complete this exercise __________ times per day.  RANGE OF MOTION - Extension, Prone Press Ups  Lie on your stomach on the floor, a bed will be too soft. Place your palms about shoulder width apart and at the height of your head.  Keeping your back as relaxed as possible, slowly straighten your elbows while keeping your hips on the floor. You may adjust the placement of your hands to maximize your comfort. As you gain motion, your hands will come more underneath your shoulders.  Hold this position __________ seconds.  Slowly return to lying flat on the floor. Repeat __________ times. Complete this exercise __________ times per day.  STRENGTHENING EXERCISES - Sciatica  These exercises  may help you when beginning to rehabilitate your injury. These exercises  should be done near your "sweet spot." This is the neutral, low-back arch, somewhere between fully rounded and fully arched, that is your least painful position. When performed in this safe range of motion, these exercises can be used for people who have either a flexion or extension based injury. These exercises may resolve your symptoms with or without further involvement from your physician, physical therapist or athletic trainer. While completing these exercises, remember:   Muscles can gain both the endurance and the strength needed for everyday activities through controlled exercises.  Complete these exercises as instructed by your physician, physical therapist or athletic trainer. Progress with the resistance and repetition exercises only as your caregiver advises.  You may experience muscle soreness or fatigue, but the pain or discomfort you are trying to eliminate should never worsen during these exercises. If this pain does worsen, stop and make certain you are following the directions exactly. If the pain is still present after adjustments, discontinue the exercise until you can discuss the trouble with your clinician. STRENGTHENING - Deep Abdominals, Pelvic Tilt   Lie on a firm bed or floor. Keeping your legs in front of you, bend your knees so they are both pointed toward the ceiling and your feet are flat on the floor.  Tense your lower abdominal muscles to press your low back into the floor. This motion will rotate your pelvis so that your tail bone is scooping upwards rather than pointing at your feet or into the floor.  With a gentle tension and even breathing, hold this position for __________ seconds. Repeat __________ times. Complete this exercise __________ times per day.  STRENGTHENING - Abdominals, Crunches   Lie on a firm bed or floor. Keeping your legs in front of you, bend your knees so they are both pointed toward the ceiling and your feet are flat on the floor. Cross your  arms over your chest.  Slightly tip your chin down without bending your neck.  Tense your abdominals and slowly lift your trunk high enough to just clear your shoulder blades. Lifting higher can put excessive stress on the low back and does not further strengthen your abdominal muscles.  Control your return to the starting position. Repeat __________ times. Complete this exercise __________ times per day.  STRENGTHENING - Quadruped, Opposite UE/LE Lift  Assume a hands and knees position on a firm surface. Keep your hands under your shoulders and your knees under your hips. You may place padding under your knees for comfort.  Find your neutral spine and gently tense your abdominal muscles so that you can maintain this position. Your shoulders and hips should form a rectangle that is parallel with the floor and is not twisted.  Keeping your trunk steady, lift your right hand no higher than your shoulder and then your left leg no higher than your hip. Make sure you are not holding your breath. Hold this position __________ seconds.  Continuing to keep your abdominal muscles tense and your back steady, slowly return to your starting position. Repeat with the opposite arm and leg. Repeat __________ times. Complete this exercise __________ times per day.  STRENGTHENING - Abdominals and Quadriceps, Straight Leg Raise   Lie on a firm bed or floor with both legs extended in front of you.  Keeping one leg in contact with the floor, bend the other knee so that your foot can rest flat on the floor.  Find  your neutral spine, and tense your abdominal muscles to maintain your spinal position throughout the exercise.  Slowly lift your straight leg off the floor about 6 inches for a count of 15, making sure to not hold your breath.  Still keeping your neutral spine, slowly lower your leg all the way to the floor. Repeat this exercise with each leg __________ times. Complete this exercise __________ times  per day. POSTURE AND BODY MECHANICS CONSIDERATIONS - Sciatica Keeping correct posture when sitting, standing or completing your activities will reduce the stress put on different body tissues, allowing injured tissues a chance to heal and limiting painful experiences. The following are general guidelines for improved posture. Your physician or physical therapist will provide you with any instructions specific to your needs. While reading these guidelines, remember:  The exercises prescribed by your provider will help you have the flexibility and strength to maintain correct postures.  The correct posture provides the optimal environment for your joints to work. All of your joints have less wear and tear when properly supported by a spine with good posture. This means you will experience a healthier, less painful body.  Correct posture must be practiced with all of your activities, especially prolonged sitting and standing. Correct posture is as important when doing repetitive low-stress activities (typing) as it is when doing a single heavy-load activity (lifting). RESTING POSITIONS Consider which positions are most painful for you when choosing a resting position. If you have pain with flexion-based activities (sitting, bending, stooping, squatting), choose a position that allows you to rest in a less flexed posture. You would want to avoid curling into a fetal position on your side. If your pain worsens with extension-based activities (prolonged standing, working overhead), avoid resting in an extended position such as sleeping on your stomach. Most people will find more comfort when they rest with their spine in a more neutral position, neither too rounded nor too arched. Lying on a non-sagging bed on your side with a pillow between your knees, or on your back with a pillow under your knees will often provide some relief. Keep in mind, being in any one position for a prolonged period of time, no matter  how correct your posture, can still lead to stiffness. PROPER SITTING POSTURE In order to minimize stress and discomfort on your spine, you must sit with correct posture Sitting with good posture should be effortless for a healthy body. Returning to good posture is a gradual process. Many people can work toward this most comfortably by using various supports until they have the flexibility and strength to maintain this posture on their own. When sitting with proper posture, your ears will fall over your shoulders and your shoulders will fall over your hips. You should use the back of the chair to support your upper back. Your low back will be in a neutral position, just slightly arched. You may place a small pillow or folded towel at the base of your low back for support.  When working at a desk, create an environment that supports good, upright posture. Without extra support, muscles fatigue and lead to excessive strain on joints and other tissues. Keep these recommendations in mind: CHAIR:   A chair should be able to slide under your desk when your back makes contact with the back of the chair. This allows you to work closely.  The chair's height should allow your eyes to be level with the upper part of your monitor and your hands to  be slightly lower than your elbows. BODY POSITION  Your feet should make contact with the floor. If this is not possible, use a foot rest.  Keep your ears over your shoulders. This will reduce stress on your neck and low back. INCORRECT SITTING POSTURES   If you are feeling tired and unable to assume a healthy sitting posture, do not slouch or slump. This puts excessive strain on your back tissues, causing more damage and pain. Healthier options include:  Using more support, like a lumbar pillow.  Switching tasks to something that requires you to be upright or walking.  Talking a brief walk.  Lying down to rest in a neutral-spine position. PROLONGED STANDING  WHILE SLIGHTLY LEANING FORWARD  When completing a task that requires you to lean forward while standing in one place for a long time, place either foot up on a stationary 2-4 inch high object to help maintain the best posture. When both feet are on the ground, the low back tends to lose its slight inward curve. If this curve flattens (or becomes too large), then the back and your other joints will experience too much stress, fatigue more quickly and can cause pain.  CORRECT STANDING POSTURES Proper standing posture should be assumed with all daily activities, even if they only take a few moments, like when brushing your teeth. As in sitting, your ears should fall over your shoulders and your shoulders should fall over your hips. You should keep a slight tension in your abdominal muscles to brace your spine. Your tailbone should point down to the ground, not behind your body, resulting in an over-extended swayback posture.  INCORRECT STANDING POSTURES  Common incorrect standing postures include a forward head, locked knees and/or an excessive swayback. WALKING Walk with an upright posture. Your ears, shoulders and hips should all line-up. PROLONGED ACTIVITY IN A FLEXED POSITION When completing a task that requires you to bend forward at your waist or lean over a low surface, try to find a way to stabilize 3 of 4 of your limbs. You can place a hand or elbow on your thigh or rest a knee on the surface you are reaching across. This will provide you more stability so that your muscles do not fatigue as quickly. By keeping your knees relaxed, or slightly bent, you will also reduce stress across your low back. CORRECT LIFTING TECHNIQUES DO :   Assume a wide stance. This will provide you more stability and the opportunity to get as close as possible to the object which you are lifting.  Tense your abdominals to brace your spine; then bend at the knees and hips. Keeping your back locked in a neutral-spine  position, lift using your leg muscles. Lift with your legs, keeping your back straight.  Test the weight of unknown objects before attempting to lift them.  Try to keep your elbows locked down at your sides in order get the best strength from your shoulders when carrying an object.  Always ask for help when lifting heavy or awkward objects. INCORRECT LIFTING TECHNIQUES DO NOT:   Lock your knees when lifting, even if it is a small object.  Bend and twist. Pivot at your feet or move your feet when needing to change directions.  Assume that you cannot safely pick up a paperclip without proper posture. Document Released: 12/20/2004 Document Revised: 05/06/2013 Document Reviewed: 04/03/2008 [facility] Patient Information 2015 [place], [place]. This information is not intended to replace advice given to you by your  health care provider. Make sure you discuss any questions you have with your health care provider.  Thinking About Doren Custard???  Why consider waterbirth? . Gentle birth for babies . Less pain medicine used in labor . May allow for passive descent/less pushing . May reduce perineal tears  . More mobility and instinctive maternal position changes . Increased maternal relaxation . Reduced blood pressure in labor  Is waterbirth safe? What are the risks of infection, drowning or other complications? . Infection o Very low risk (3.7 % for tub vs 4.8% for bed) o 7 in 8000 waterbirths with documented infection o Poorly cleaned equipment most common cause o Slightly lower group B strep transmission rate  . Drowning o Maternal:  - Very low risk   - Related to seizures or fainting o Newborn:  - Very low risk. No evidence of increased risk of respiratory problems in multiple large studies - Physiological protection from breathing under water - Avoid underwater birth if there are any fetal complications - Once baby's head is out of the water, keep it out.  . Birth  complication o Some reports of cord trauma, but risk decreased by bringing baby to surface gradually o No evidence of increased risk of shoulder dystocia. Mothers can usually change positions faster in water than in a bed, possibly aiding the maneuvers to free the shoulder.  You must attend a Doren Custard class at [facility]  3rd Wednesday of every month from 7-9pm  Free  [employer] by calling [phone] or online at [email or web address]  Bring [place] the certificate from the class  Waterbirth supplies needed for [facility] patients:  Our practice has a Heritage manager in a Box tub at the hospital that you can borrow  You will need to purchase an accessory kit that has all needed supplies through [employer] (  ) or online  Or you can purchase the supplies separately: o Single-use disposable tub liner for [employer] in a Box (REGULAR size) o New garden hose labeled "lead-free", "suitable for drinking water", "non-toxic" OR "water potable" o Garden hose to remove the dirty water o Electric drain pump to remove water (We recommend 792 gallon per hour or greater pump.)  o Fish net o Bathing suit top (optional) o Long-handled mirror (optional)  [employer] sells tubs for ~ $120 if you would rather purchase your own tub  [employer] (www.thelaborladies.com) $275 for tub rental/set-up & take down/kit   Things that would prevent you from having a waterbirth:  Premature, <37wks  Previous cesarean birth  Presence of thick meconium-stained fluid  Multiple gestation (Twins, triplets, etc.)  Uncontrolled diabetes  Hypertension  Heavy vaginal bleeding  Non-reassuring fetal heart rate  Active infection (MRSA, etc.)  If your labor has to be induced  Other risk issues identified by your obstetrical provider

## 2014-05-05 NOTE — Progress Notes (Signed)
US [redacted]W[redacted]d measurements c/w dates,normal ov's,no obvious cranial abnormalities seen,cephalic,cx 2.6 cm, post pl gr 0,afi 14cm

## 2014-05-05 NOTE — Progress Notes (Signed)
Low-risk OB appointment [id number] [id number] Estimated Date of Delivery: 07/30/14 BP 120/84 mmHg  Pulse 98  Wt 232 lb 8 oz (105.461 kg)  LMP 09/19/2013 (Within Months)  BP, weight, and urine reviewed.  Refer to obstetrical flow sheet for FH & FHR.  Reports good fm.  Denies regular uc's, lof, vb, or uti s/s. Sciatica- gave printed exercised. Interested in waterbirth- discussed, gave class info Reviewed today's f/u anatomy u/s- now complete and normal. Discussed ptl s/s, fkc. Recommended Tdap at HD/PCP per CDC guidelines.  Plan:  Continue routine obstetrical care  F/U in 4wks for OB appointment  PN2 today

## 2014-05-06 LAB — RPR: RPR Ser Ql: NONREACTIVE

## 2014-05-06 LAB — CBC
Hematocrit: 36.5 % (ref 34.0–46.6)
Hemoglobin: 12.5 g/dL (ref 11.1–15.9)
MCH: 29 pg (ref 26.6–33.0)
MCHC: 34.2 g/dL (ref 31.5–35.7)
MCV: 85 fL (ref 79–97)
Platelets: 209 10*3/uL (ref 150–379)
RBC: 4.31 x10E6/uL (ref 3.77–5.28)
RDW: 15.3 % (ref 12.3–15.4)
WBC: 9.1 10*3/uL (ref 3.4–10.8)

## 2014-05-06 LAB — HIV ANTIBODY (ROUTINE TESTING W REFLEX): HIV Screen 4th Generation wRfx: NONREACTIVE

## 2014-05-06 LAB — GLUCOSE TOLERANCE, 2 HOURS W/ 1HR
GLUCOSE, 2 HOUR: 94 mg/dL (ref 65–152)
Glucose, 1 hour: 113 mg/dL (ref 65–179)
Glucose, Fasting: 79 mg/dL (ref 65–91)

## 2014-05-06 LAB — HSV 2 ANTIBODY, IGG

## 2014-05-06 LAB — ANTIBODY SCREEN: Antibody Screen: NEGATIVE

## 2014-05-21 ENCOUNTER — Emergency Department (HOSPITAL_COMMUNITY)
Admission: EM | Admit: 2014-05-21 | Discharge: 2014-05-22 | Disposition: A | Discharge status: Home / Self Care | Payer: Medicaid Other | Attending: Emergency Medicine | Admitting: Emergency Medicine

## 2014-05-21 ENCOUNTER — Encounter (HOSPITAL_COMMUNITY): Payer: Self-pay | Admitting: Emergency Medicine

## 2014-05-21 DIAGNOSIS — O9989 Other specified diseases and conditions complicating pregnancy, childbirth and the puerperium: Secondary | ICD-10-CM | POA: Diagnosis present

## 2014-05-21 DIAGNOSIS — O212 Late vomiting of pregnancy: Secondary | ICD-10-CM | POA: Diagnosis not present

## 2014-05-21 DIAGNOSIS — O219 Vomiting of pregnancy, unspecified: Secondary | ICD-10-CM

## 2014-05-21 DIAGNOSIS — Z79899 Other long term (current) drug therapy: Secondary | ICD-10-CM | POA: Diagnosis not present

## 2014-05-21 DIAGNOSIS — Z880 Allergy status to penicillin: Secondary | ICD-10-CM | POA: Diagnosis not present

## 2014-05-21 DIAGNOSIS — Z3A30 30 weeks gestation of pregnancy: Secondary | ICD-10-CM | POA: Insufficient documentation

## 2014-05-21 DIAGNOSIS — R1013 Epigastric pain: Secondary | ICD-10-CM

## 2014-05-21 LAB — CBC WITH DIFFERENTIAL/PLATELET
Basophils Absolute: 0 10*3/uL (ref 0.0–0.1)
Basophils Relative: 0 % (ref 0–1)
EOS ABS: 0.1 10*3/uL (ref 0.0–0.7)
EOS PCT: 1 % (ref 0–5)
HEMATOCRIT: 34.9 % — AB (ref 36.0–46.0)
Hemoglobin: 11.8 g/dL — ABNORMAL LOW (ref 12.0–15.0)
LYMPHS ABS: 2.1 10*3/uL (ref 0.7–4.0)
Lymphocytes Relative: 16 % (ref 12–46)
MCH: 28.4 pg (ref 26.0–34.0)
MCHC: 33.8 g/dL (ref 30.0–36.0)
MCV: 84.1 fL (ref 78.0–100.0)
Monocytes Absolute: 0.8 10*3/uL (ref 0.1–1.0)
Monocytes Relative: 6 % (ref 3–12)
Neutro Abs: 10.1 10*3/uL — ABNORMAL HIGH (ref 1.7–7.7)
Neutrophils Relative %: 77 % (ref 43–77)
PLATELETS: 195 10*3/uL (ref 150–400)
RBC: 4.15 MIL/uL (ref 3.87–5.11)
RDW: 14.2 % (ref 11.5–15.5)
WBC: 13.1 10*3/uL — AB (ref 4.0–10.5)

## 2014-05-21 LAB — HEPATIC FUNCTION PANEL
ALT: 16 U/L (ref 14–54)
AST: 15 U/L (ref 15–41)
Albumin: 2.9 g/dL — ABNORMAL LOW (ref 3.5–5.0)
Alkaline Phosphatase: 68 U/L (ref 38–126)
Bilirubin, Direct: 0.1 mg/dL (ref 0.1–0.5)
Indirect Bilirubin: 0.4 mg/dL (ref 0.3–0.9)
TOTAL PROTEIN: 6.2 g/dL — AB (ref 6.5–8.1)
Total Bilirubin: 0.5 mg/dL (ref 0.3–1.2)

## 2014-05-21 LAB — BASIC METABOLIC PANEL
Anion gap: 9 (ref 5–15)
BUN: 10 mg/dL (ref 6–20)
CO2: 22 mmol/L (ref 22–32)
CREATININE: 0.45 mg/dL (ref 0.44–1.00)
Calcium: 8.5 mg/dL — ABNORMAL LOW (ref 8.9–10.3)
Chloride: 105 mmol/L (ref 101–111)
GFR calc Af Amer: >60 mL/min (ref >60)
GFR calc non Af Amer: >60 mL/min (ref >60)
GLUCOSE: 87 mg/dL (ref 65–99)
Potassium: 3.6 mmol/L (ref 3.5–5.1)
Sodium: 136 mmol/L (ref 135–145)

## 2014-05-21 LAB — LIPASE, BLOOD: Lipase: 21 U/L — ABNORMAL LOW (ref 22–51)

## 2014-05-21 MED ORDER — SODIUM CHLORIDE 0.9 % IV BOLUS (SEPSIS)
1000.0000 mL | Freq: Once | INTRAVENOUS | Status: AC
  Administered 2014-05-21: 1000 mL via INTRAVENOUS

## 2014-05-21 MED ORDER — ONDANSETRON HCL 4 MG/2ML IJ SOLN
4.0000 mg | Freq: Once | INTRAMUSCULAR | Status: AC
  Administered 2014-05-21: 4 mg via INTRAVENOUS
  Filled 2014-05-21: qty 2

## 2014-05-21 NOTE — ED Notes (Signed)
Pt c/o abd pain with n/v for 2 days. Pt is [redacted] weeks pregnant.

## 2014-05-22 LAB — URINALYSIS, ROUTINE W REFLEX MICROSCOPIC
Bilirubin Urine: NEGATIVE
Glucose, UA: NEGATIVE mg/dL
HGB URINE DIPSTICK: NEGATIVE
Ketones, ur: >80 mg/dL — AB
Leukocytes, UA: NEGATIVE
NITRITE: NEGATIVE
Protein, ur: NEGATIVE mg/dL
Specific Gravity, Urine: 1.025 (ref 1.005–1.030)
UROBILINOGEN UA: 0.2 mg/dL (ref 0.0–1.0)
pH: 7 (ref 5.0–8.0)

## 2014-05-22 MED ORDER — SODIUM CHLORIDE 0.9 % IV BOLUS (SEPSIS)
1000.0000 mL | Freq: Once | INTRAVENOUS | Status: AC
Start: 2014-05-22 — End: 2014-05-22
  Administered 2014-05-22: 1000 mL via INTRAVENOUS

## 2014-05-22 MED ORDER — ONDANSETRON HCL 8 MG PO TABS: 4.0000 mg | ORAL_TABLET | Freq: Three times a day (TID) | ORAL | Status: DC | PRN

## 2014-05-22 NOTE — ED Notes (Signed)
Pt given ginger ale and saltine crackers.  

## 2014-05-22 NOTE — Progress Notes (Signed)
Fetal monitor not tracing. Requested monitor adjustment.

## 2014-05-22 NOTE — ED Notes (Signed)
Pt ready to leave 

## 2014-05-22 NOTE — Discharge Instructions (Signed)

## 2014-05-22 NOTE — ED Provider Notes (Signed)
CSN: [id number]     Arrival date & time 05/21/14  2119 History   First MD Initiated Contact with Patient 05/21/14 2245     Chief Complaint  Patient presents with  . Abdominal Pain     (Consider location/radiation/quality/duration/timing/severity/associated sxs/prior Treatment) The history is provided by the patient.   Sandra Frost is a 24 y.o. female G4P3 currently [redacted] weeks pregnant presenting with a 2 day history of intermittent epigastric discomfort, nausea and vomiting.  She states she has been unable to keep any po intake down since yesterday.  She denies fevers, chills, back pain, sob, chest pain, diarrhea and has had no vaginal discharge or spotting.  She denies lower abdominal pain or cramping. She has tried using phenergan without relief of sx.       Past Medical History  Diagnosis Date  . Bradycardia 2012    one episode, 3-day hospitalization, no Rx  . Medical history non-contributory    History reviewed. No pertinent past surgical history. Family History  Problem Relation Age of Onset  . Diabetes Mother   . Seizures Father   . Asthma Daughter   . Cancer Maternal Grandmother     breast  . Heart disease Maternal Grandmother   . Stroke Maternal Grandfather   . Cancer Paternal Grandmother     breast   History  Substance Use Topics  . Smoking status: Never Smoker   . Smokeless tobacco: Never Used  . Alcohol Use: No   OB History    Gravida Para Term Preterm AB TAB SAB Ectopic Multiple Living   5 3 3  0 1 0 1 0 0 3     Review of Systems  Constitutional: Negative for fever and chills.  HENT: Negative for congestion and sore throat.   Eyes: Negative.   Respiratory: Negative for chest tightness and shortness of breath.   Cardiovascular: Negative for chest pain.  Gastrointestinal: Positive for nausea, vomiting and abdominal pain. Negative for diarrhea.  Genitourinary: Negative.  Negative for dysuria, vaginal bleeding and vaginal discharge.  Musculoskeletal:  Negative for joint swelling, arthralgias and neck pain.  Skin: Negative.  Negative for rash and wound.  Neurological: Negative for dizziness, weakness, light-headedness, numbness and headaches.  Psychiatric/Behavioral: Negative.       Allergies  Penicillins  Home Medications   Prior to Admission medications   Medication Sig Start Date End Date Taking? Authorizing Provider  acetaminophen (TYLENOL) 500 MG tablet Take 500 mg by mouth every 8 (eight) hours as needed for mild pain.   Yes Historical Provider, MD  Prenatal Vit-Fe Fumarate-FA (MULTIVITAMIN-PRENATAL) 27-0.8 MG TABS tablet Take 1 tablet by mouth daily.    Yes Historical Provider, MD  promethazine (PHENERGAN) 25 MG tablet Take 0.5-1 tablets (12.5-25 mg total) by mouth every 6 (six) hours as needed for nausea or vomiting. 03/10/14  Yes Cheral MarkerKimberly R Booker, CNM  butalbital-acetaminophen-caffeine (FIORICET) [phone] MG per tablet Take 1 tablet by mouth every 4 (four) hours as needed for headache or migraine. Patient not taking: Reported on 05/21/2014 02/10/14   Cheral MarkerKimberly R Booker, CNM  cephALEXin (KEFLEX) 500 MG capsule Take 1 capsule (500 mg total) by mouth 3 (three) times daily. Patient not taking: Reported on 05/05/2014 04/25/14   Lazaro ArmsLuther H Eure, MD  ondansetron (ZOFRAN) 8 MG tablet Take 0.5 tablets (4 mg total) by mouth every 8 (eight) hours as needed for nausea or vomiting. 05/22/14   Burgess AmorJulie Ivett Luebbe, PA-C   BP 93/50 mmHg  Pulse 110  Temp(Src) 98.7 F (37.1 C) (  Oral)  Resp 17  Ht 5' (1.524 m)  Wt 229 lb 8 oz (104.101 kg)  BMI 44.82 kg/m2  SpO2 99%  LMP 09/19/2013 (Within Months) Physical Exam  Constitutional: She appears well-developed and well-nourished.  HENT:  Head: Normocephalic and atraumatic.  Eyes: Conjunctivae are normal.  Neck: Normal range of motion.  Cardiovascular: Normal rate, regular rhythm, normal heart sounds and intact distal pulses.   Pulmonary/Chest: Effort normal and breath sounds normal. She has no wheezes.   Abdominal: Soft. Bowel sounds are normal. There is tenderness in the epigastric area. There is no rigidity, no guarding and no CVA tenderness.  Gravid.   Musculoskeletal: Normal range of motion.  Neurological: She is alert.  Skin: Skin is warm and dry.  Psychiatric: She has a normal mood and affect.  Nursing note and vitals reviewed.   ED Course  Procedures (including critical care time) Labs Review Labs Reviewed  CBC WITH DIFFERENTIAL/PLATELET - Abnormal; Notable for the following:    WBC 13.1 (*)    Hemoglobin 11.8 (*)    HCT 34.9 (*)    Neutro Abs 10.1 (*)    All other components within normal limits  BASIC METABOLIC PANEL - Abnormal; Notable for the following:    Calcium 8.5 (*)    All other components within normal limits  URINALYSIS, ROUTINE W REFLEX MICROSCOPIC - Abnormal; Notable for the following:    Ketones, ur >80 (*)    All other components within normal limits  HEPATIC FUNCTION PANEL - Abnormal; Notable for the following:    Total Protein 6.2 (*)    Albumin 2.9 (*)    All other components within normal limits  LIPASE, BLOOD - Abnormal; Notable for the following:    Lipase 21 (*)    All other components within normal limits    Imaging Review No results found.   EKG Interpretation None      MDM   Final diagnoses:  Nausea and vomiting during pregnancy  Epigastric pain    Patients labs and/or radiological studies were reviewed and considered during the medical decision making and disposition process.  Results were also discussed with patient. Pt was given NS 2 liters per IV.  zofran IV given, pt tolerated PO intake including ginger ale, peanut butter crackers without emesis.  She will complete her last NS liter prior to dc home.  zofran prescribed for nausea.  Encouraged f/u with pcp OB tomorrow if sx persist.    Pt was kept on toco during ed visit - fhr 135 -140,  Moderate variability without decelerations.  Discussed with Dr. Wilkie AyeHorton prior to dc  home.    Burgess AmorJulie Spyros Winch, PA-C 05/22/14 [id number]  Shon Batonourtney F Horton, MD 05/22/14 [id number]

## 2014-06-03 ENCOUNTER — Ambulatory Visit (INDEPENDENT_AMBULATORY_CARE_PROVIDER_SITE_OTHER): Payer: Medicaid Other | Admitting: Women's Health

## 2014-06-03 ENCOUNTER — Encounter: Payer: Self-pay | Admitting: Women's Health

## 2014-06-03 VITALS — BP 104/58 | HR 92 | Wt 231.0 lb

## 2014-06-03 DIAGNOSIS — Z1389 Encounter for screening for other disorder: Secondary | ICD-10-CM

## 2014-06-03 DIAGNOSIS — Z3493 Encounter for supervision of normal pregnancy, unspecified, third trimester: Secondary | ICD-10-CM

## 2014-06-03 DIAGNOSIS — Z331 Pregnant state, incidental: Secondary | ICD-10-CM

## 2014-06-03 LAB — POCT URINALYSIS DIPSTICK
GLUCOSE UA: NEGATIVE
Ketones, UA: NEGATIVE
Nitrite, UA: NEGATIVE
RBC UA: NEGATIVE

## 2014-06-03 NOTE — Patient Instructions (Signed)
Call the office [phone]) or go to [facility] if:  You begin to have strong, frequent contractions  Your water breaks.  Sometimes it is a big gush of fluid, sometimes it is just a trickle that keeps getting your panties wet or running down your legs  You have vaginal bleeding.  It is normal to have a small amount of spotting if your cervix was checked.   You don't feel your baby moving like normal.  If you don't, get you something to eat and drink and lay down and focus on feeling your baby move.  You should feel at least 10 movements in 2 hours.  If you don't, you should call the office or go to [place]???  Why consider waterbirth? . Gentle birth for babies . Less pain medicine used in labor . May allow for passive descent/less pushing . May reduce perineal tears  . More mobility and instinctive maternal position changes . Increased maternal relaxation . Reduced blood pressure in labor  Is waterbirth safe? What are the risks of infection, drowning or other complications? . Infection o Very low risk (3.7 % for tub vs 4.8% for bed) o 7 in 8000 waterbirths with documented infection o Poorly cleaned equipment most common cause o Slightly lower group B strep transmission rate  . Drowning o Maternal:  - Very low risk   - Related to seizures or fainting o Newborn:  - Very low risk. No evidence of increased risk of respiratory problems in multiple large studies - Physiological protection from breathing under water - Avoid underwater birth if there are any fetal complications - Once baby's head is out of the water, keep it out.  . Birth complication o Some reports of cord trauma, but risk decreased by bringing baby to surface gradually o No evidence of increased risk of shoulder dystocia. Mothers can usually change positions faster in water than in a bed, possibly aiding the maneuvers to free the shoulder.  You must attend a Doren Custard  class at [facility]  3rd Wednesday of every month from 7-9pm  Free  [employer] by calling [phone] or online at [email or web address]  Bring [place] the certificate from the class  Waterbirth supplies needed for [facility] patients:  Our practice has a [employer] manager in a Box tub at the hospital that you can borrow  You will need to purchase an accessory kit that has all needed supplies through [employer] (  ) or online  Or you can purchase the supplies separately: o Single-use disposable tub liner for [employer] in a Box (REGULAR size) o New garden hose labeled "lead-free", "suitable for drinking water", "non-toxic" OR "water potable" o Garden hose to remove the dirty water o Electric drain pump to remove water (We recommend 792 gallon per hour or greater pump.)  o Fish net o Bathing suit top (optional) o Long-handled mirror (optional)  [employer] sells tubs for ~ $120 if you would rather purchase your own tub  [employer] (www.thelaborladies.com) $275 for tub rental/set-up & take down/kit   Things that would prevent you from having a waterbirth:  Premature, <37wks  Previous cesarean birth  Presence of thick meconium-stained fluid  Multiple gestation (Twins, triplets, etc.)  Uncontrolled diabetes  Hypertension  Heavy vaginal bleeding  Non-reassuring fetal heart rate  Active infection (MRSA, etc.)  If your labor has to be induced  Other risk issues identified by your obstetrical provider

## 2014-06-03 NOTE — Progress Notes (Signed)
Low-risk OB appointment [id number] 549w6d Estimated Date of Delivery: 07/30/14 BP 104/58 mmHg  Pulse 92  Wt 231 lb (104.781 kg)  LMP 09/19/2013 (Within Months)  BP, weight, and urine reviewed.  Refer to obstetrical flow sheet for FH & FHR.  Reports good fm.  Denies regular uc's, lof, vb, or uti s/s. No complaints.  Still interested in waterbirth, hasn't taken class yet- needs to schedule asap- gave printed waterbirth info Reviewed ptl s/s, fkc, normal pn2 results. Plan:  Continue routine obstetrical care  F/U in 2wks for OB appointment

## 2014-06-17 ENCOUNTER — Ambulatory Visit (INDEPENDENT_AMBULATORY_CARE_PROVIDER_SITE_OTHER): Payer: Medicaid Other | Admitting: Advanced Practice Midwife

## 2014-06-17 VITALS — BP 104/54 | HR 93 | Wt 232.0 lb

## 2014-06-17 DIAGNOSIS — O368131 Decreased fetal movements, third trimester, fetus 1: Secondary | ICD-10-CM

## 2014-06-17 DIAGNOSIS — Z331 Pregnant state, incidental: Secondary | ICD-10-CM

## 2014-06-17 DIAGNOSIS — Z3493 Encounter for supervision of normal pregnancy, unspecified, third trimester: Secondary | ICD-10-CM

## 2014-06-17 DIAGNOSIS — Z3A33 33 weeks gestation of pregnancy: Secondary | ICD-10-CM

## 2014-06-17 DIAGNOSIS — Z1389 Encounter for screening for other disorder: Secondary | ICD-10-CM

## 2014-06-17 LAB — POCT URINALYSIS DIPSTICK
Blood, UA: NEGATIVE
GLUCOSE UA: NEGATIVE
KETONES UA: NEGATIVE
LEUKOCYTES UA: NEGATIVE
NITRITE UA: NEGATIVE
Protein, UA: NEGATIVE

## 2014-06-17 NOTE — Progress Notes (Signed)
[id number] [redacted]w[redacted]d Estimated Date of Delivery: 07/30/14  Blood pressure 104/54, pulse 93, weight 232 lb (105.235 kg), last menstrual period 09/19/2013, not currently breastfeeding.   BP weight and urine results all reviewed and noted.  Please refer to the obstetrical flow sheet for the fundal height and fetal heart rate documentation:  Patient reports good fetal movement, denies any bleeding and no rupture of membranes symptoms or regular contractions. Patient c/o decreased FM today.  NST reactive and baby very active now.  All questions were answered.  Plan:  Continued routine obstetrical care,   Follow up in 2 weeks for OB appointment,

## 2014-06-19 ENCOUNTER — Telehealth: Payer: Self-pay | Admitting: *Deleted

## 2014-06-19 ENCOUNTER — Encounter: Payer: Self-pay | Admitting: Women's Health

## 2014-06-19 NOTE — Telephone Encounter (Signed)
Pt informed losing mucus plug does not mean in active labor or anything to be concerned about. Pt states +FM, Braxton Hicks contractions, no other complaints or symptoms. Pt saw Cathie Beams, CNM in the office this past Tuesday, June 17, 2014 by Cathie Beams, CNM.

## 2014-07-01 ENCOUNTER — Ambulatory Visit (INDEPENDENT_AMBULATORY_CARE_PROVIDER_SITE_OTHER): Payer: Medicaid Other | Admitting: Women's Health

## 2014-07-01 ENCOUNTER — Encounter: Payer: Self-pay | Admitting: Women's Health

## 2014-07-01 VITALS — BP 100/56 | HR 100 | Wt 230.5 lb

## 2014-07-01 DIAGNOSIS — Z3493 Encounter for supervision of normal pregnancy, unspecified, third trimester: Secondary | ICD-10-CM

## 2014-07-01 DIAGNOSIS — Z331 Pregnant state, incidental: Secondary | ICD-10-CM

## 2014-07-01 DIAGNOSIS — Z1389 Encounter for screening for other disorder: Secondary | ICD-10-CM

## 2014-07-01 NOTE — Progress Notes (Signed)
Pt denies any problems or concerns at this time.  

## 2014-07-01 NOTE — Progress Notes (Signed)
Low-risk OB appointment [id number] 6258w6d Estimated Date of Delivery: 07/30/14 BP 100/56 mmHg  Pulse 100  Wt 230 lb 8 oz (104.554 kg)  LMP 09/19/2013 (Within Months)  BP, weight, and urine reviewed.  Refer to obstetrical flow sheet for FH & FHR.  Reports good fm.  Denies regular uc's, lof, vb, or uti s/s. No complaints. No longer interested in waterbirth Reviewed ptl s/s, fkc. Plan:  Continue routine obstetrical care  F/U in 1wk for OB appointment and gbs

## 2014-07-02 ENCOUNTER — Encounter: Payer: Self-pay | Admitting: Obstetrics and Gynecology

## 2014-07-02 ENCOUNTER — Ambulatory Visit (INDEPENDENT_AMBULATORY_CARE_PROVIDER_SITE_OTHER): Payer: Medicaid Other | Admitting: Obstetrics and Gynecology

## 2014-07-02 VITALS — BP 108/60 | HR 100 | Wt 231.0 lb

## 2014-07-02 DIAGNOSIS — O368190 Decreased fetal movements, unspecified trimester, not applicable or unspecified: Secondary | ICD-10-CM | POA: Diagnosis not present

## 2014-07-02 LAB — POCT URINALYSIS DIPSTICK
Blood, UA: NEGATIVE
Glucose, UA: NEGATIVE
KETONES UA: NEGATIVE
Leukocytes, UA: NEGATIVE
Nitrite, UA: NEGATIVE

## 2014-07-02 NOTE — Progress Notes (Signed)
Patient ID: Meryle ReadySamantha Killion, female   DOB: 02/11/1990, [age] y.o.   MRN: [id number] [facility] IpWORK-IN APPOINTMENT [id number] Estimated Date of Delivery: 07/30/14  Blood pressure 108/60, pulse 100, weight 231 lb (104.781 kg), last menstrual period 09/19/2013, not currently breastfeeding.   refer to the ob flow sheet for FH and FHR, also BP, Wt, Urine results:notable for negative  Patient reports decreased fetal movement, denies any bleeding and no rupture of membranes symptoms or regular contractions. Patient complaints: Patient was worried about decreased perceived fetal movement.  NST reactive with 15x 15 accels and pt felt movement all thru NST. Questions were answered. Assessment: Pregnancy 36w, LROB, NST done-normal. Pt reassured. Plan:  Continued routine obstetrical care  F/u in 1 weeks for pnx care   This chart was SCRIBED for Christin BachJohn Aulden Calise, MD by Ronney LionSuzanne Le, ED Scribe. This patient was seen in room . and the patient's care was started at 3:56 PM.  I personally performed the services described in this documentation, which was SCRIBED in my presence. The recorded information has been reviewed and considered accurate. It has been edited as necessary during review. Tilda BurrowFERGUSON,Kanasia Gayman V, MD

## 2014-07-08 ENCOUNTER — Ambulatory Visit (INDEPENDENT_AMBULATORY_CARE_PROVIDER_SITE_OTHER): Payer: Medicaid Other | Admitting: Obstetrics & Gynecology

## 2014-07-08 ENCOUNTER — Encounter: Payer: Self-pay | Admitting: Obstetrics & Gynecology

## 2014-07-08 VITALS — BP 100/60 | HR 100 | Wt 230.0 lb

## 2014-07-08 DIAGNOSIS — Z369 Encounter for antenatal screening, unspecified: Secondary | ICD-10-CM

## 2014-07-08 DIAGNOSIS — Z3493 Encounter for supervision of normal pregnancy, unspecified, third trimester: Secondary | ICD-10-CM

## 2014-07-08 DIAGNOSIS — Z1389 Encounter for screening for other disorder: Secondary | ICD-10-CM

## 2014-07-08 DIAGNOSIS — Z331 Pregnant state, incidental: Secondary | ICD-10-CM

## 2014-07-08 LAB — POCT URINALYSIS DIPSTICK
GLUCOSE UA: NEGATIVE
Ketones, UA: NEGATIVE
Leukocytes, UA: NEGATIVE
NITRITE UA: NEGATIVE
RBC UA: NEGATIVE

## 2014-07-08 NOTE — Progress Notes (Signed)
[id number] 6110w6d Estimated Date of Delivery: 07/30/14  Blood pressure 100/60, pulse 100, weight 230 lb (104.327 kg), last menstrual period 09/19/2013, not currently breastfeeding.   BP weight and urine results all reviewed and noted.  Please refer to the obstetrical flow sheet for the fundal height and fetal heart rate documentation:  Patient reports good fetal movement, denies any bleeding and no rupture of membranes symptoms or regular contractions. Patient is without complaints. All questions were answered.  Plan:  Continued routine obstetrical care,   Follow up in 1 weeks for OB appointment,

## 2014-07-09 LAB — GC/CHLAMYDIA PROBE AMP
Chlamydia trachomatis, NAA: NEGATIVE
Neisseria gonorrhoeae by PCR: NEGATIVE

## 2014-07-12 LAB — CULTURE, BETA STREP (GROUP B ONLY): Strep Gp B Culture: NEGATIVE

## 2014-07-14 ENCOUNTER — Ambulatory Visit (INDEPENDENT_AMBULATORY_CARE_PROVIDER_SITE_OTHER): Payer: Medicaid Other | Admitting: Women's Health

## 2014-07-14 VITALS — BP 112/74 | HR 96 | Wt 235.5 lb

## 2014-07-14 DIAGNOSIS — Z3493 Encounter for supervision of normal pregnancy, unspecified, third trimester: Secondary | ICD-10-CM

## 2014-07-14 DIAGNOSIS — O479 False labor, unspecified: Secondary | ICD-10-CM

## 2014-07-14 NOTE — Progress Notes (Signed)
Work- in Low-risk OB appointment [id number] 3355w5d Estimated Date of Delivery: 07/30/14 BP 112/74 mmHg  Pulse 96  Wt 235 lb 8 oz (106.822 kg)  LMP 09/19/2013 (Within Months)  BP, weight reviewed.  Unable to void. Refer to obstetrical flow sheet for FH & FHR.  Reports good fm.  Denies lof, vb, or uti s/s. Regular uc's began at 1100, ~ q 1-8110mins now.  SVE: 1.5/50/-2, vtx Reactive NST, uc's not tracing well, ~ q 3-5, likely early labor Reviewed labor s/s, fkc. Plan:  Continue routine obstetrical care  F/U in 1wk for OB appointment  Order nexplanon today

## 2014-07-14 NOTE — Patient Instructions (Signed)
Call the office ([phone]) or go to [facility] if:  You begin to have strong, frequent contractions  Your water breaks.  Sometimes it is a big gush of fluid, sometimes it is just a trickle that keeps getting your panties wet or running down your legs  You have vaginal bleeding.  It is normal to have a small amount of spotting if your cervix was checked.   You don't feel your baby moving like normal.  If you don't, get you something to eat and drink and lay down and focus on feeling your baby move.  You should feel at least 10 movements in 2 hours.  If you don't, you should call the office or go to [facility].    Braxton Hicks Contractions Contractions of the uterus can occur throughout pregnancy. Contractions are not always a sign that you are in labor.  WHAT ARE BRAXTON HICKS CONTRACTIONS?  Contractions that occur before labor are called Braxton Hicks contractions, or false labor. Toward the end of pregnancy (32-34 weeks), these contractions can develop more often and may become more forceful. This is not true labor because these contractions do not result in opening (dilatation) and thinning of the cervix. They are sometimes difficult to tell apart from true labor because these contractions can be forceful and people have different pain tolerances. You should not feel embarrassed if you go to the hospital with false labor. Sometimes, the only way to tell if you are in true labor is for your health care provider to look for changes in the cervix. If there are no prenatal problems or other health problems associated with the pregnancy, it is completely safe to be sent home with false labor and await the onset of true labor. HOW CAN YOU TELL THE DIFFERENCE BETWEEN TRUE AND FALSE LABOR? False Labor  The contractions of false labor are usually shorter and not as hard as those of true labor.   The contractions are usually irregular.   The contractions are often felt in the front of  the lower abdomen and in the groin.   The contractions may go away when you walk around or change positions while lying down.   The contractions get weaker and are shorter lasting as time goes on.   The contractions do not usually become progressively stronger, regular, and closer together as with true labor.  True Labor  Contractions in true labor last 30-70 seconds, become very regular, usually become more intense, and increase in frequency.   The contractions do not go away with walking.   The discomfort is usually felt in the top of the uterus and spreads to the lower abdomen and low back.   True labor can be determined by your health care provider with an exam. This will show that the cervix is dilating and getting thinner.  WHAT TO REMEMBER  Keep up with your usual exercises and follow other instructions given by your health care provider.   Take medicines as directed by your health care provider.   Keep your regular prenatal appointments.   Eat and drink lightly if you think you are going into labor.   If Braxton Hicks contractions are making you uncomfortable:   Change your position from lying down or resting to walking, or from walking to resting.   Sit and rest in a tub of warm water.   Drink 2-3 glasses of water. Dehydration may cause these contractions.   Do slow and deep breathing several times an hour.    WHEN SHOULD I SEEK IMMEDIATE MEDICAL CARE? Seek immediate medical care if:  Your contractions become stronger, more regular, and closer together.   You have fluid leaking or gushing from your vagina.   You have a fever.   You pass blood-tinged mucus.   You have vaginal bleeding.   You have continuous abdominal pain.   You have low back pain that you never had before.   You feel your baby's head pushing down and causing pelvic pressure.   Your baby is not moving as much as it used to.  Document Released: 12/20/2004 Document  Revised: 12/25/2012 Document Reviewed: 10/01/2012 [employer] Patient Information 2015 [employer]. This information is not intended to replace advice given to you by your health care provider. Make sure you discuss any questions you have with your health care provider.  

## 2014-07-15 ENCOUNTER — Inpatient Hospital Stay (HOSPITAL_COMMUNITY)
Admission: AD | Admit: 2014-07-15 | Discharge: 2014-07-15 | Disposition: A | Discharge status: Home / Self Care | Payer: Medicaid Other | Source: Ambulatory Visit | Attending: Obstetrics and Gynecology | Admitting: Obstetrics and Gynecology

## 2014-07-15 ENCOUNTER — Encounter: Payer: Self-pay | Admitting: Women's Health

## 2014-07-15 ENCOUNTER — Encounter: Payer: Medicaid Other | Admitting: Advanced Practice Midwife

## 2014-07-15 ENCOUNTER — Encounter (HOSPITAL_COMMUNITY): Payer: Self-pay | Admitting: *Deleted

## 2014-07-15 ENCOUNTER — Telehealth: Payer: Self-pay | Admitting: Women's Health

## 2014-07-15 DIAGNOSIS — Z3A37 37 weeks gestation of pregnancy: Secondary | ICD-10-CM | POA: Diagnosis not present

## 2014-07-15 DIAGNOSIS — Z3493 Encounter for supervision of normal pregnancy, unspecified, third trimester: Secondary | ICD-10-CM

## 2014-07-15 NOTE — Telephone Encounter (Signed)
Pt c/o contractions yesterday and continuing today, now 5 min apart. pt states contractions more intense and has not felt the baby move today, no gush of fluids but loss mucus plug. Pt advised per office protocol when having contractions 5-10 minutes apart go to MAU for evaluation. Pt verbalized understanding.

## 2014-07-15 NOTE — MAU Note (Signed)
Have been contracting since 1100 yesterday. Was 1+ yesterday. Some spotting yesterday. No problems with preg

## 2014-07-15 NOTE — Discharge Instructions (Signed)
Continue to walk. When contractions become more regular, come back in for cervical exam.

## 2014-07-15 NOTE — OB Triage Provider Note (Signed)
Patient is 24 y.o. [id number] here for labor evaluation.   NST reactive  Dilation: 3 Effacement (%): 50 Station: -3 Presentation: Vertex Exam by:: Dr. Aloha GellWallace Karen, RN checked behind and agrees with exam.   OB fellow attestation:  The resident, RN and I spoke about this patient; I agree with above documentation in the resident's note.   Federico FlakeKimberly Niles Camia Dipinto, MD 9:57 PM

## 2014-07-17 ENCOUNTER — Encounter: Payer: Medicaid Other | Admitting: Advanced Practice Midwife

## 2014-07-17 ENCOUNTER — Other Ambulatory Visit: Payer: Medicaid Other

## 2014-07-21 ENCOUNTER — Encounter: Payer: Self-pay | Admitting: Women's Health

## 2014-07-21 ENCOUNTER — Ambulatory Visit (INDEPENDENT_AMBULATORY_CARE_PROVIDER_SITE_OTHER): Payer: Medicaid Other | Admitting: Women's Health

## 2014-07-21 VITALS — BP 120/56 | HR 88 | Wt 235.0 lb

## 2014-07-21 DIAGNOSIS — Z1389 Encounter for screening for other disorder: Secondary | ICD-10-CM

## 2014-07-21 DIAGNOSIS — Z331 Pregnant state, incidental: Secondary | ICD-10-CM

## 2014-07-21 DIAGNOSIS — Z3493 Encounter for supervision of normal pregnancy, unspecified, third trimester: Secondary | ICD-10-CM

## 2014-07-21 LAB — POCT URINALYSIS DIPSTICK
Glucose, UA: NEGATIVE
Ketones, UA: NEGATIVE
Leukocytes, UA: NEGATIVE
Nitrite, UA: NEGATIVE
Protein, UA: NEGATIVE
RBC UA: NEGATIVE

## 2014-07-21 NOTE — Patient Instructions (Signed)
Call the office ([phone]) or go to [facility] if:  You begin to have strong, frequent contractions  Your water breaks.  Sometimes it is a big gush of fluid, sometimes it is just a trickle that keeps getting your panties wet or running down your legs  You have vaginal bleeding.  It is normal to have a small amount of spotting if your cervix was checked.   You don't feel your baby moving like normal.  If you don't, get you something to eat and drink and lay down and focus on feeling your baby move.  You should feel at least 10 movements in 2 hours.  If you don't, you should call the office or go to [facility].    Braxton Hicks Contractions Contractions of the uterus can occur throughout pregnancy. Contractions are not always a sign that you are in labor.  WHAT ARE BRAXTON HICKS CONTRACTIONS?  Contractions that occur before labor are called Braxton Hicks contractions, or false labor. Toward the end of pregnancy (32-34 weeks), these contractions can develop more often and may become more forceful. This is not true labor because these contractions do not result in opening (dilatation) and thinning of the cervix. They are sometimes difficult to tell apart from true labor because these contractions can be forceful and people have different pain tolerances. You should not feel embarrassed if you go to the hospital with false labor. Sometimes, the only way to tell if you are in true labor is for your health care provider to look for changes in the cervix. If there are no prenatal problems or other health problems associated with the pregnancy, it is completely safe to be sent home with false labor and await the onset of true labor. HOW CAN YOU TELL THE DIFFERENCE BETWEEN TRUE AND FALSE LABOR? False Labor  The contractions of false labor are usually shorter and not as hard as those of true labor.   The contractions are usually irregular.   The contractions are often felt in the front of  the lower abdomen and in the groin.   The contractions may go away when you walk around or change positions while lying down.   The contractions get weaker and are shorter lasting as time goes on.   The contractions do not usually become progressively stronger, regular, and closer together as with true labor.  True Labor  Contractions in true labor last 30-70 seconds, become very regular, usually become more intense, and increase in frequency.   The contractions do not go away with walking.   The discomfort is usually felt in the top of the uterus and spreads to the lower abdomen and low back.   True labor can be determined by your health care provider with an exam. This will show that the cervix is dilating and getting thinner.  WHAT TO REMEMBER  Keep up with your usual exercises and follow other instructions given by your health care provider.   Take medicines as directed by your health care provider.   Keep your regular prenatal appointments.   Eat and drink lightly if you think you are going into labor.   If Braxton Hicks contractions are making you uncomfortable:   Change your position from lying down or resting to walking, or from walking to resting.   Sit and rest in a tub of warm water.   Drink 2-3 glasses of water. Dehydration may cause these contractions.   Do slow and deep breathing several times an hour.    WHEN SHOULD I SEEK IMMEDIATE MEDICAL CARE? Seek immediate medical care if:  Your contractions become stronger, more regular, and closer together.   You have fluid leaking or gushing from your vagina.   You have a fever.   You pass blood-tinged mucus.   You have vaginal bleeding.   You have continuous abdominal pain.   You have low back pain that you never had before.   You feel your baby's head pushing down and causing pelvic pressure.   Your baby is not moving as much as it used to.  Document Released: 12/20/2004 Document  Revised: 12/25/2012 Document Reviewed: 10/01/2012 [employer] Patient Information 2015 [employer]. This information is not intended to replace advice given to you by your health care provider. Make sure you discuss any questions you have with your health care provider.  

## 2014-07-21 NOTE — Progress Notes (Signed)
Low-risk OB appointment [id number] Estimated Date of Delivery: 07/30/14 BP 120/56 mmHg  Pulse 88  Wt 235 lb (106.595 kg)  LMP 09/19/2013 (Within Months)  BP, weight, and urine reviewed.  Refer to obstetrical flow sheet for FH & FHR.  Reports good fm.  Denies regular uc's, lof, vb, or uti s/s. No complaints. SVE per request: 3/50/-2, vtx Reviewed labor s/s, fkc. Plan:  Continue routine obstetrical care  F/U in 1wk for OB appointment

## 2014-07-28 ENCOUNTER — Ambulatory Visit (INDEPENDENT_AMBULATORY_CARE_PROVIDER_SITE_OTHER): Payer: Medicaid Other | Admitting: Women's Health

## 2014-07-28 ENCOUNTER — Encounter: Payer: Self-pay | Admitting: Women's Health

## 2014-07-28 VITALS — BP 110/62 | HR 76 | Wt 235.0 lb

## 2014-07-28 DIAGNOSIS — Z3493 Encounter for supervision of normal pregnancy, unspecified, third trimester: Secondary | ICD-10-CM

## 2014-07-28 DIAGNOSIS — Z331 Pregnant state, incidental: Secondary | ICD-10-CM

## 2014-07-28 DIAGNOSIS — Z1389 Encounter for screening for other disorder: Secondary | ICD-10-CM

## 2014-07-28 LAB — POCT URINALYSIS DIPSTICK
GLUCOSE UA: NEGATIVE
KETONES UA: NEGATIVE
LEUKOCYTES UA: NEGATIVE
NITRITE UA: NEGATIVE
PROTEIN UA: NEGATIVE
RBC UA: NEGATIVE

## 2014-07-28 NOTE — Patient Instructions (Signed)
Your induction is scheduled for 8/3 @ 7:30am. Go to [facility] hospital, Maternity Admissions Unit (Emergency) entrance and let them know you are there to be induced. They will send someone from Labor & Delivery to come get you.    Call the office [phone]) or go to [facility] if:  You begin to have strong, frequent contractions  Your water breaks.  Sometimes it is a big gush of fluid, sometimes it is just a trickle that keeps getting your panties wet or running down your legs  You have vaginal bleeding.  It is normal to have a small amount of spotting if your cervix was checked.   You don't feel your baby moving like normal.  If you don't, get you something to eat and drink and lay down and focus on feeling your baby move.  You should feel at least 10 movements in 2 hours.  If you don't, you should call the office or go to [facility].    [facility] Contractions Contractions of the uterus can occur throughout pregnancy. Contractions are not always a sign that you are in labor.  WHAT ARE BRAXTON HICKS CONTRACTIONS?  Contractions that occur before labor are called Braxton Hicks contractions, or false labor. Toward the end of pregnancy (32-34 weeks), these contractions can develop more often and may become more forceful. This is not true labor because these contractions do not result in opening (dilatation) and thinning of the cervix. They are sometimes difficult to tell apart from true labor because these contractions can be forceful and people have different pain tolerances. You should not feel embarrassed if you go to the hospital with false labor. Sometimes, the only way to tell if you are in true labor is for your health care provider to look for changes in the cervix. If there are no prenatal problems or other health problems associated with the pregnancy, it is completely safe to be sent home with false labor and await the onset of true labor. HOW CAN YOU TELL THE DIFFERENCE BETWEEN  TRUE AND FALSE LABOR? False Labor  The contractions of false labor are usually shorter and not as hard as those of true labor.   The contractions are usually irregular.   The contractions are often felt in the front of the lower abdomen and in the groin.   The contractions may go away when you walk around or change positions while lying down.   The contractions get weaker and are shorter lasting as time goes on.   The contractions do not usually become progressively stronger, regular, and closer together as with true labor.  True Labor  Contractions in true labor last 30-70 seconds, become very regular, usually become more intense, and increase in frequency.   The contractions do not go away with walking.   The discomfort is usually felt in the top of the uterus and spreads to the lower abdomen and low back.   True labor can be determined by your health care provider with an exam. This will show that the cervix is dilating and getting thinner.  WHAT TO REMEMBER  Keep up with your usual exercises and follow other instructions given by your health care provider.   Take medicines as directed by your health care provider.   Keep your regular prenatal appointments.   Eat and drink lightly if you think you are going into labor.   If Braxton Hicks contractions are making you uncomfortable:   Change your position from lying down or  resting to walking, or from walking to resting.   Sit and rest in a tub of warm water.   Drink 2-3 glasses of water. Dehydration may cause these contractions.   Do slow and deep breathing several times an hour.  WHEN SHOULD I SEEK IMMEDIATE MEDICAL CARE? Seek immediate medical care if:  Your contractions become stronger, more regular, and closer together.   You have fluid leaking or gushing from your vagina.   You have a fever.   You pass blood-tinged mucus.   You have vaginal bleeding.   You have continuous abdominal  pain.   You have low back pain that you never had before.   You feel your baby's head pushing down and causing pelvic pressure.   Your baby is not moving as much as it used to.  Document Released: 12/20/2004 Document Revised: 12/25/2012 Document Reviewed: 10/01/2012 [facility] Patient Information 2015 [place], [place]. This information is not intended to replace advice given to you by your health care provider. Make sure you discuss any questions you have with your health care provider.

## 2014-07-28 NOTE — Progress Notes (Signed)
Low-risk OB appointment [id number] [redacted]w[redacted]d Estimated Date of Delivery: 07/30/14 BP 110/62 mmHg  Pulse 76  Wt 235 lb (106.595 kg)  LMP 09/19/2013 (Within Months)  BP, weight, and urine reviewed.  Refer to obstetrical flow sheet for FH & FHR.  Reports good fm.  Denies regular uc's, vb, or uti s/s. Periodic gushes of fluid since Friday- thinks she peeing on herself.  SSE: cx visually closed, mod amt mucousy d/c, no pooling/no change w/ valsalva, fern neg SVE per request: 3/50/-2, bag of water palpated. Requests membrane sweeping, discussed r/b- pt wishes to proceed, membranes swept- cx now 4/50/-2, vtx Reviewed labor s/s, fkc. Plan:  Continue routine obstetrical care. IOL scheduled for 8/3 @ 0730 if needed for postdates F/U in 1wk for OB appointment

## 2014-07-29 ENCOUNTER — Inpatient Hospital Stay (HOSPITAL_COMMUNITY)
Admission: AD | Admit: 2014-07-29 | Discharge: 2014-07-29 | Disposition: A | Discharge status: Home / Self Care | Payer: Medicaid Other | Source: Ambulatory Visit | Attending: Obstetrics and Gynecology | Admitting: Obstetrics and Gynecology

## 2014-07-29 ENCOUNTER — Encounter (HOSPITAL_COMMUNITY): Payer: Self-pay | Admitting: *Deleted

## 2014-07-29 ENCOUNTER — Encounter: Payer: Self-pay | Admitting: Advanced Practice Midwife

## 2014-07-29 ENCOUNTER — Inpatient Hospital Stay (HOSPITAL_COMMUNITY)
Admission: AD | Admit: 2014-07-29 | Discharge: 2014-07-29 | Disposition: A | Discharge status: Home / Self Care | Payer: Medicaid Other | Source: Ambulatory Visit | Attending: Obstetrics & Gynecology | Admitting: Obstetrics & Gynecology

## 2014-07-29 DIAGNOSIS — Z3493 Encounter for supervision of normal pregnancy, unspecified, third trimester: Secondary | ICD-10-CM

## 2014-07-29 HISTORY — DX: Headache, unspecified: R51.9

## 2014-07-29 HISTORY — DX: Headache: R51

## 2014-07-29 HISTORY — DX: Unspecified infectious disease: B99.9

## 2014-07-29 HISTORY — DX: Unspecified ovarian cyst, unspecified side: N83.209

## 2014-07-29 LAB — POCT FERN TEST

## 2014-07-29 NOTE — MAU Note (Signed)
Contractions every 5-7 mins rates 8/10 pain. Denies LOF, but some spotting. Membrane sweep in office. Was 4cm. +FM

## 2014-07-29 NOTE — MAU Note (Addendum)
Contractions started yesterday, have been getting closer and stronger. No leaking. Small amt of bleeding this morning. Was 4 cm when last checked.

## 2014-07-29 NOTE — Discharge Instructions (Signed)

## 2014-07-29 NOTE — MAU Note (Signed)
Pt may be discharged to home with instructions. 

## 2014-07-29 NOTE — Discharge Instructions (Signed)
Braxton Hicks Contractions °Contractions of the uterus can occur throughout pregnancy. Contractions are not always a sign that you are in labor.  °WHAT ARE BRAXTON HICKS CONTRACTIONS?  °Contractions that occur before labor are called Braxton Hicks contractions, or false labor. Toward the end of pregnancy (32-34 weeks), these contractions can develop more often and may become more forceful. This is not true labor because these contractions do not result in opening (dilatation) and thinning of the cervix. They are sometimes difficult to tell apart from true labor because these contractions can be forceful and people have different pain tolerances. You should not feel embarrassed if you go to the hospital with false labor. Sometimes, the only way to tell if you are in true labor is for your health care provider to look for changes in the cervix. °If there are no prenatal problems or other health problems associated with the pregnancy, it is completely safe to be sent home with false labor and await the onset of true labor. °HOW CAN YOU TELL THE DIFFERENCE BETWEEN TRUE AND FALSE LABOR? °False Labor °· The contractions of false labor are usually shorter and not as hard as those of true labor.   °· The contractions are usually irregular.   °· The contractions are often felt in the front of the lower abdomen and in the groin.   °· The contractions may go away when you walk around or change positions while lying down.   °· The contractions get weaker and are shorter lasting as time goes on.   °· The contractions do not usually become progressively stronger, regular, and closer together as with true labor.   °True Labor °· Contractions in true labor last 30-70 seconds, become very regular, usually become more intense, and increase in frequency.   °· The contractions do not go away with walking.   °· The discomfort is usually felt in the top of the uterus and spreads to the lower abdomen and low back.   °· True labor can be  determined by your health care provider with an exam. This will show that the cervix is dilating and getting thinner.   °WHAT TO REMEMBER °· Keep up with your usual exercises and follow other instructions given by your health care provider.   °· Take medicines as directed by your health care provider.   °· Keep your regular prenatal appointments.   °· Eat and drink lightly if you think you are going into labor.   °· If Braxton Hicks contractions are making you uncomfortable:   °¨ Change your position from lying down or resting to walking, or from walking to resting.   °¨ Sit and rest in a tub of warm water.   °¨ Drink 2-3 glasses of water. Dehydration may cause these contractions.   °¨ Do slow and deep breathing several times an hour.   °WHEN SHOULD I SEEK IMMEDIATE MEDICAL CARE? °Seek immediate medical care if: °· Your contractions become stronger, more regular, and closer together.   °· You have fluid leaking or gushing from your vagina.   °· You have a fever.   °· You pass blood-tinged mucus.   °· You have vaginal bleeding.   °· You have continuous abdominal pain.   °· You have low back pain that you never had before.   °· You feel your baby's head pushing down and causing pelvic pressure.   °· Your baby is not moving as much as it used to.   °Document Released: 12/20/2004 Document Revised: 12/25/2012 Document Reviewed: 10/01/2012 °[employer]® Patient Information ©2015 [employer]. This information is not intended to replace advice given to you by your health care   provider. Make sure you discuss any questions you have with your health care provider. ° °

## 2014-08-05 ENCOUNTER — Encounter: Payer: Medicaid Other | Admitting: Advanced Practice Midwife

## 2014-08-06 ENCOUNTER — Inpatient Hospital Stay (HOSPITAL_COMMUNITY): Payer: Medicaid Other

## 2014-08-19 ENCOUNTER — Telehealth: Payer: Self-pay | Admitting: *Deleted

## 2014-08-19 NOTE — Telephone Encounter (Signed)
Pt delivered 07/30/2014 SVD, pt c/o redness and tenderness, is breastfeeding. Pt given an appt tomorrow with Cyril Mourning, NP for evaluation.

## 2014-08-20 ENCOUNTER — Telehealth: Payer: Self-pay | Admitting: *Deleted

## 2014-08-20 ENCOUNTER — Ambulatory Visit (INDEPENDENT_AMBULATORY_CARE_PROVIDER_SITE_OTHER): Payer: Medicaid Other | Admitting: Adult Health

## 2014-08-20 ENCOUNTER — Encounter: Payer: Self-pay | Admitting: Adult Health

## 2014-08-20 VITALS — BP 90/60 | HR 120 | Temp 100.3°F | Ht 64.0 in | Wt 215.5 lb

## 2014-08-20 DIAGNOSIS — N61 Inflammatory disorders of breast: Secondary | ICD-10-CM | POA: Diagnosis not present

## 2014-08-20 HISTORY — DX: Mastitis without abscess: N61.0

## 2014-08-20 MED ORDER — CLINDAMYCIN HCL 300 MG PO CAPS: 300.0000 mg | ORAL_CAPSULE | Freq: Three times a day (TID) | ORAL | Status: DC

## 2014-08-20 NOTE — Telephone Encounter (Signed)
Pt being seen today

## 2014-08-20 NOTE — Progress Notes (Signed)
Subjective:     Patient ID: Sandra Frost, female   DOB: 10/15/1990, 24 y.o.   MRN: [id number]  HPI Sandra Frost is a 24 year old white female, who delivered 07/30/14 and is breastfeeding exclusively, and complains of redness and pain in right breast. Has had before but fed through it and it has gotten worse, in last week or so.  Review of Systems +breast pain and redness, all other systems negative Reviewed past medical,surgical, social and family history. Reviewed medications and allergies.     Objective:   Physical Exam BP 90/60 mmHg  Pulse 120  Temp(Src) 100.3 F (37.9 C)  Ht  (1.626 m)  Wt 215 lb 8 oz (97.75 kg)  BMI 36.97 kg/m2  LMP 09/19/2013 (Within Months)  Breastfeeding? Yes  Skin warm and dry,  Breasts:no dominate palpable mass, retraction or nipple discharge, has milk from nipple, on left,On right has no retraction,has milk from nipples and has redness at 5 o'clock and is tender and has lumps for 5 to 7 o'clock, she has PCN allergy.     Assessment:     Mastitis     Plan:     Rx clindamycin 300 mg #30 take 1 tid x 10 days Use motrin prn Follow up in 1 week Schedule postpartum visit for 9/7 Use cabbage leaves and can use crushed ice in a towel for 10 minutes, and increase feeding or pumping

## 2014-08-20 NOTE — Patient Instructions (Signed)
Breastfeeding and Mastitis Mastitis is inflammation of the breast tissue. It can occur in women who are breastfeeding. This can make breastfeeding painful. Mastitis will sometimes go away on its own. Your health care provider will help determine if treatment is needed. CAUSES Mastitis is often associated with a blocked milk (lactiferous) duct. This can happen when too much milk builds up in the breast. Causes of excess milk in the breast can include:  Poor latch-on. If your baby is not latched onto the breast properly, she or he may not empty your breast completely while breastfeeding.  Allowing too much time to pass between feedings.  Wearing a bra or other clothing that is too tight. This puts extra pressure on the lactiferous ducts so milk does not flow through them as it should. Mastitis can also be caused by a bacterial infection. Bacteria may enter the breast tissue through cuts or openings in the skin. In women who are breastfeeding, this may occur because of cracked or irritated skin. Cracks in the skin are often caused when your baby does not latch on properly to the breast. SIGNS AND SYMPTOMS  Swelling, redness, tenderness, and pain in an area of the breast.  Swelling of the glands under the arm on the same side.  Fever may or may not accompany mastitis. If an infection is allowed to progress, a collection of pus (abscess) may develop. DIAGNOSIS  Your health care provider can usually diagnose mastitis based on your symptoms and a physical exam. Tests may be done to help confirm the diagnosis. These may include:  Removal of pus from the breast by applying pressure to the area. This pus can be examined in the lab to determine which bacteria are present. If an abscess has developed, the fluid in the abscess can be removed with a needle. This can also be used to confirm the diagnosis and determine the bacteria present. In most cases, pus will not be present.  Blood tests to determine if  your body is fighting a bacterial infection.  Mammogram or ultrasound tests to rule out other problems or diseases. TREATMENT  Mastitis that occurs with breastfeeding will sometimes go away on its own. Your health care provider may choose to wait 24 hours after first seeing you to decide whether a prescription medicine is needed. If your symptoms are worse after 24 hours, your health care provider will likely prescribe an antibiotic medicine to treat the mastitis. He or she will determine which bacteria are most likely causing the infection and will then select an appropriate antibiotic medicine. This is sometimes changed based on the results of tests performed to identify the bacteria, or if there is no response to the antibiotic medicine selected. Antibiotic medicines are usually given by mouth. You may also be given medicine for pain. HOME CARE INSTRUCTIONS  Only take over-the-counter or prescription medicines for pain, fever, or discomfort as directed by your health care provider.  If your health care provider prescribed an antibiotic medicine, take the medicine as directed. Make sure you finish it even if you start to feel better.  Do not wear a tight or underwire bra. Wear a soft, supportive bra.  Increase your fluid intake, especially if you have a fever.  Continue to empty the breast. Your health care provider can tell you whether this milk is safe for your infant or needs to be thrown out. You may be told to stop nursing until your health care provider thinks it is safe for your baby.   Use a breast pump if you are advised to stop nursing.  Keep your nipples clean and dry.  Empty the first breast completely before going to the other breast. If your baby is not emptying your breasts completely for some reason, use a breast pump to empty your breasts.  If you go back to work, pump your breasts while at work to stay in time with your nursing schedule.  Avoid allowing your breasts to become  overly filled with milk (engorged). SEEK MEDICAL CARE IF:  You have pus-like discharge from the breast.  Your symptoms do not improve with the treatment prescribed by your health care provider within 2 days. SEEK IMMEDIATE MEDICAL CARE IF:  Your pain and swelling are getting worse.  You have pain that is not controlled with medicine.  You have a red line extending from the breast toward your armpit.  You have a fever or persistent symptoms for more than 2-3 days.  You have a fever and your symptoms suddenly get worse. MAKE SURE YOU:   Understand these instructions.  Will watch your condition.  Will get help right away if you are not doing well or get worse. Document Released: 04/16/2004 Document Revised: 12/25/2012 Document Reviewed: 07/26/2012 [facility] Patient Information 2015 [place], [place]. This information is not intended to replace advice given to you by your health care provider. Make sure you discuss any questions you have with your health care provider. Take meds tid x 10 days Use cabbage leaves Crushed ice in  Towel over breast 10 minutes  Follow up in 1 week and schedule postpartum visit for 9/7 Mastitis Mastitis is inflammation of the breast tissue. It occurs most often in women who are breastfeeding, but it can also affect other women, and even sometimes men. CAUSES  Mastitis is usually caused by a bacterial infection. Bacteria enter the breast tissue through cuts or openings in the skin. Typically, this occurs with breastfeeding because of cracked or irritated skin. Sometimes, it can occur even when there is no opening in the skin. It can be associated with plugged milk (lactiferous) ducts. Nipple piercing can also lead to mastitis. Also, some forms of breast cancer can cause mastitis. SIGNS AND SYMPTOMS  Swelling, redness, tenderness, and pain in an area of the breast. Swelling of the glands under the arm on the same side. Fever. If an infection is allowed to  progress, a collection of pus (abscess) may develop. DIAGNOSIS  Your health care provider can usually diagnose mastitis based on your symptoms and a physical exam. Tests may be done to help confirm the diagnosis. These may include:  Removal of pus from the breast by applying pressure to the area. This pus can be examined in the lab to determine which bacteria are present. If an abscess has developed, the fluid in the abscess can be removed with a needle. This can also be used to confirm the diagnosis and determine the bacteria present. In most cases, pus will not be present. Blood tests to determine if your body is fighting a bacterial infection. Mammogram or ultrasound tests to rule out other problems or diseases. TREATMENT  Antibiotic medicine is used to treat a bacterial infection. Your health care provider will determine which bacteria are most likely causing the infection and will select an appropriate antibiotic. This is sometimes changed based on the results of tests performed to identify the bacteria, or if there is no response to the antibiotic selected. Antibiotics are usually given by mouth. You may also  be given medicine for pain. Mastitis that occurs with breastfeeding will sometimes go away on its own, so your health care provider may choose to wait 24 hours after first seeing you to decide whether a prescription medicine is needed. HOME CARE INSTRUCTIONS  Only take over-the-counter or prescription medicines for pain, fever, or discomfort as directed by your health care provider. If your health care provider prescribed an antibiotic, take the medicine as directed. Make sure you finish it even if you start to feel better. Do not wear a tight or underwire bra. Wear a soft, supportive bra. Increase your fluid intake, especially if you have a fever. Women who are breastfeeding should follow these instructions: Continue to empty the breast. Your health care provider can tell you whether this  milk is safe for your infant or needs to be thrown out. You may be told to stop nursing until your health care provider thinks it is safe for your baby. Use a breast pump if you are advised to stop nursing. Keep your nipples clean and dry. Empty the first breast completely before going to the other breast. If your baby is not emptying your breasts completely for some reason, use a breast pump to empty your breasts. If you go back to work, pump your breasts while at work to stay in time with your nursing schedule. Avoid allowing your breasts to become overly filled with milk (engorged). SEEK MEDICAL CARE IF:  You have pus-like discharge from the breast. Your symptoms do not improve with the treatment prescribed by your health care provider within 2 days. SEEK IMMEDIATE MEDICAL CARE IF:  Your pain and swelling are getting worse. You have pain that is not controlled with medicine. You have a red line extending from the breast toward your armpit. You have a fever or persistent symptoms for more than 2-3 days. You have a fever and your symptoms suddenly get worse. Document Released: 12/20/2004 Document Revised: 12/25/2012 Document Reviewed: 07/20/2012 [facility] Patient Information 2015 [place], [place]. This information is not intended to replace advice given to you by your health care provider. Make sure you discuss any questions you have with your health care provider.

## 2014-08-27 ENCOUNTER — Encounter: Payer: Self-pay | Admitting: Adult Health

## 2014-08-27 ENCOUNTER — Ambulatory Visit (INDEPENDENT_AMBULATORY_CARE_PROVIDER_SITE_OTHER): Payer: Medicaid Other | Admitting: Adult Health

## 2014-08-27 VITALS — BP 118/82 | HR 60 | Ht 60.0 in | Wt 214.5 lb

## 2014-08-27 DIAGNOSIS — N61 Inflammatory disorders of breast: Secondary | ICD-10-CM

## 2014-08-27 DIAGNOSIS — Z87898 Personal history of other specified conditions: Secondary | ICD-10-CM

## 2014-08-27 HISTORY — DX: Personal history of other specified conditions: Z87.898

## 2014-08-27 NOTE — Progress Notes (Signed)
Subjective:     Patient ID: Sandra Frost, female   DOB: 06/12/1990, 24 y.o.   MRN: [id number]  HPI Sandra Frost is a 24 year old white female, back in follow up of right mastitis, was seen 8/17 and prescribed clindamycin, she feels much better.  Review of Systems No breast pain, all other systems negative Reviewed past medical,surgical, social and family history. Reviewed medications and allergies.     Objective:   Physical Exam BP 118/82 mmHg  Pulse 60  Ht 5' (1.524 m)  Wt 214 lb 8 oz (97.297 kg)  BMI 41.89 kg/m2  LMP 09/19/2013 (Within Months)  Breastfeeding? Yes    Skin warm and dry,  Breasts:no dominate palpable mass, retraction or nipple discharge, mastitis resolved, no pain or redness.  Assessment:     History of mastitis, resolved    Plan:     Finish clindamycin Return 9/7 for postpartum visit

## 2014-08-27 NOTE — Patient Instructions (Signed)
Finish antibiotics Return 09/10/14

## 2014-09-10 ENCOUNTER — Ambulatory Visit: Payer: Medicaid Other | Admitting: Adult Health

## 2014-09-22 ENCOUNTER — Ambulatory Visit (INDEPENDENT_AMBULATORY_CARE_PROVIDER_SITE_OTHER): Payer: Medicaid Other | Admitting: Women's Health

## 2014-09-22 ENCOUNTER — Encounter: Payer: Self-pay | Admitting: Women's Health

## 2014-09-22 DIAGNOSIS — Z3202 Encounter for pregnancy test, result negative: Secondary | ICD-10-CM

## 2014-09-22 DIAGNOSIS — Z3200 Encounter for pregnancy test, result unknown: Secondary | ICD-10-CM

## 2014-09-22 LAB — POCT URINE PREGNANCY: Preg Test, Ur: NEGATIVE

## 2014-09-22 MED ORDER — NORETHINDRONE 0.35 MG PO TABS: ORAL_TABLET | ORAL | Status: DC

## 2014-09-22 NOTE — Progress Notes (Signed)
Patient ID: Sandra Frost, female   DOB: Feb 20, 1990, 24 y.o.   MRN: [id number] Subjective:    Sandra Frost is a 24 y.o. [phone] Caucasian female who presents for a postpartum visit. She is 7 weeks postpartum following a spontaneous vaginal delivery at 40 gestational weeks at [facility] after being sent home from [facility]. Anesthesia: epidural. I have fully reviewed the prenatal and intrapartum course. Postpartum course has been complicatd by mastitis- now resolved. Baby's course has been uncomplicated. Baby is feeding by breast. Bleeding no bleeding. Bowel function is normal. Bladder function is normal. Patient is sexually active. Last sexual activity: 9/1. LMP 9/8. Contraception method is condoms and wants POPs. Postpartum depression screening: positive. Score 12, however denies depression/anxiety- states she is doing fine. Denies SI/HI/II.  Last pap 12/17/13 and was neg.  The following portions of the patient's history were reviewed and updated as appropriate: allergies, current medications, past medical history, past surgical history and problem list.  Review of Systems Pertinent items are noted in HPI.   Filed Vitals:   09/22/14 0926  BP: 112/70  Height: 5' (1.524 m)  Weight: 215 lb (97.523 kg)   Patient's last menstrual period was 09/19/2013 (within months).  Objective:   General:  alert, cooperative and no distress   Breasts:  deferred, no complaints  Lungs: clear to auscultation bilaterally  Heart:  regular rate and rhythm  Abdomen: soft, nontender   Vulva: normal  Vagina: normal vagina  Cervix:  closed  Corpus: Well-involuted  Adnexa:  Non-palpable  Rectal Exam: No hemorrhoids        Assessment:   Postpartum exam 7 wks s/p SVB Breastfeeding Depression screening Contraception counseling   Plan:   Contraception: rx micronor w/ 11RF, use condoms first 2 wks, take at exact same time daily- set alarm to remind Follow up in: 1 year for physical or earlier if needed  Marge Duncans CNM, [facility] 09/22/2014 9:51 AM

## 2014-09-22 NOTE — Patient Instructions (Signed)
Norethindrone tablets (contraception) What is this medicine? NORETHINDRONE (nor eth IN drone) is an oral contraceptive. The product contains a female hormone known as a progestin. It is used to prevent pregnancy. This medicine may be used for other purposes; ask your health care provider or pharmacist if you have questions. COMMON BRAND NAME(S): Camila, Deblitane 28-Day, Errin, Heather, Jencycla, Jolivette, Lyza, Nor-QD, Nora-BE, Norlyroc, Ortho Micronor, Sharobel 28-Day What should I tell my health care provider before I take this medicine? They need to know if you have any of these conditions: -blood vessel disease or blood clots -breast, cervical, or vaginal cancer -diabetes -heart disease -kidney disease -liver disease -mental depression -migraine -seizures -stroke -vaginal bleeding -an unusual or allergic reaction to norethindrone, other medicines, foods, dyes, or preservatives -pregnant or trying to get pregnant -breast-feeding How should I use this medicine? Take this medicine by mouth with a glass of water. You may take it with or without food. Follow the directions on the prescription label. Take this medicine at the same time each day and in the order directed on the package. Do not take your medicine more often than directed. Contact your pediatrician regarding the use of this medicine in children. Special care may be needed. This medicine has been used in female children who have started having menstrual periods. A patient package insert for the product will be given with each prescription and refill. Read this sheet carefully each time. The sheet may change frequently. Overdosage: If you think you have taken too much of this medicine contact a poison control center or emergency room at once. NOTE: This medicine is only for you. Do not share this medicine with others. What if I miss a dose? Try not to miss a dose. Every time you miss a dose or take a dose late your chance of  pregnancy increases. When 1 pill is missed (even if only 3 hours late), take the missed pill as soon as possible and continue taking a pill each day at the regular time (use a back up method of birth control for the next 48 hours). If more than 1 dose is missed, use an additional birth control method for the rest of your pill pack until menses occurs. Contact your health care professional if more than 1 dose has been missed. What may interact with this medicine? Do not take this medicine with any of the following medications: -amprenavir or fosamprenavir -bosentan This medicine may also interact with the following medications: -antibiotics or medicines for infections, especially rifampin, rifabutin, rifapentine, and griseofulvin, and possibly penicillins or tetracyclines -aprepitant -barbiturate medicines, such as phenobarbital -carbamazepine -felbamate -modafinil -oxcarbazepine -phenytoin -ritonavir or other medicines for HIV infection or AIDS -St. John's wort -topiramate This list may not describe all possible interactions. Give your health care provider a list of all the medicines, herbs, non-prescription drugs, or dietary supplements you use. Also tell them if you smoke, drink alcohol, or use illegal drugs. Some items may interact with your medicine. What should I watch for while using this medicine? Visit your doctor or health care professional for regular checks on your progress. You will need a regular breast and pelvic exam and Pap smear while on this medicine. Use an additional method of birth control during the first cycle that you take these tablets. If you have any reason to think you are pregnant, stop taking this medicine right away and contact your doctor or health care professional. If you are taking this medicine for hormone related problems, it   may take several cycles of use to see improvement in your condition. This medicine does not protect you against HIV infection (AIDS)  or any other sexually transmitted diseases. What side effects may I notice from receiving this medicine? Side effects that you should report to your doctor or health care professional as soon as possible: -breast tenderness or discharge -pain in the abdomen, chest, groin or leg -severe headache -skin rash, itching, or hives -sudden shortness of breath -unusually weak or tired -vision or speech problems -yellowing of skin or eyes Side effects that usually do not require medical attention (report to your doctor or health care professional if they continue or are bothersome): -changes in sexual desire -change in menstrual flow -facial hair growth -fluid retention and swelling -headache -irritability -nausea -weight gain or loss This list may not describe all possible side effects. Call your doctor for medical advice about side effects. You may report side effects to FDA at 1-800-FDA-1088. Where should I keep my medicine? Keep out of the reach of children. Store at room temperature between 15 and 30 degrees C (59 and 86 degrees F). Throw away any unused medicine after the expiration date. NOTE: This sheet is a summary. It may not cover all possible information. If you have questions about this medicine, talk to your doctor, pharmacist, or health care provider.  2015, [employer]vier/[employer] Standard. (2011-09-09 16:41:35)  

## 2014-10-31 ENCOUNTER — Ambulatory Visit: Payer: Medicaid Other | Admitting: Adult Health

## 2016-03-04 ENCOUNTER — Encounter (HOSPITAL_COMMUNITY): Payer: Self-pay | Admitting: Emergency Medicine

## 2016-03-04 ENCOUNTER — Emergency Department (HOSPITAL_COMMUNITY)
Admission: EM | Admit: 2016-03-04 | Discharge: 2016-03-05 | Disposition: A | Discharge status: Home / Self Care | Payer: Medicaid Other | Attending: Emergency Medicine | Admitting: Emergency Medicine

## 2016-03-04 DIAGNOSIS — O209 Hemorrhage in early pregnancy, unspecified: Secondary | ICD-10-CM | POA: Diagnosis present

## 2016-03-04 DIAGNOSIS — Z3A08 8 weeks gestation of pregnancy: Secondary | ICD-10-CM | POA: Insufficient documentation

## 2016-03-04 DIAGNOSIS — O4690 Antepartum hemorrhage, unspecified, unspecified trimester: Secondary | ICD-10-CM

## 2016-03-04 DIAGNOSIS — N9489 Other specified conditions associated with female genital organs and menstrual cycle: Secondary | ICD-10-CM | POA: Diagnosis not present

## 2016-03-04 NOTE — ED Notes (Signed)
Informed pt of need for a urine sample. Pt states she cannot provide one at the time because of previous urination. But pt was informed to let nursing staff know when she is able.

## 2016-03-04 NOTE — ED Provider Notes (Signed)
[facility]-EM[facility]ENCY DEPT Provider Note   CSN: [id number] Arrival date & time: 03/04/16  2215     History   Chief Complaint Chief Complaint  Patient presents with  . Vaginal Bleeding    [redacted] weeks pregnant    HPI Sandra Frost is a 26 y.o. female G15P4 Abo1 currently [redacted] weeks pregnant presenting with vaginal bleeding which started around 12 noon today.  Additionally she reports mild vaginal cramping. She reports 1 or 2 small pencil point sized blood clots, otherwise pink spotting to light period like bleeding.  She has established care with Dr Donzetta Matters in [place] and has a confirmed intrauterine pregnancy with a small subchorionic hemorrhage per recent ultrasound.  She denies nausea, vomiting, fevers or dizziness.  She has taken tylenol for pain relief with no significant improvement in pain.  Patient states her blood type is O+.  The history is provided by the patient.    Past Medical History:  Diagnosis Date  . Bradycardia 2012   one episode, 3-day hospitalization, no Rx  . Headache   . History of mastitis 08/27/2014  . Infection    UTI  . Mastitis, right, acute 08/20/2014  . Ovarian cyst     Patient Active Problem List   Diagnosis Date Noted  . History of mastitis 08/27/2014  . Mastitis, right, acute 08/20/2014    No past surgical history on file.  OB History    Gravida Para Term Preterm AB Living   5 4 4  0 1 4   SAB TAB Ectopic Multiple Live Births   1 0 0 0 4       Home Medications    Prior to Admission medications   Not on File    Family History Family History  Problem Relation Age of Onset  . Diabetes Mother   . Seizures Father   . Heart disease Father   . Asthma Daughter   . Cancer Maternal Grandmother     breast  . Stroke Maternal Grandfather   . Cancer Paternal Grandmother     breast  . Heart disease Paternal Grandmother     Social History Social History  Substance Use Topics  . Smoking status: Never Smoker  . Smokeless tobacco: Never Used  .  Alcohol use No     Allergies   Penicillins   Review of Systems Review of Systems  HENT: Negative.   Respiratory: Negative.   Cardiovascular: Negative.   Gastrointestinal: Negative.   Genitourinary: Positive for vaginal bleeding. Negative for vaginal discharge.       Pelvic cramping.     Physical Exam Updated Vital Signs BP 105/74 (BP Location: Right Arm)   Pulse 85   Temp 98.9 F (37.2 C) (Oral)   Resp 16   Ht 5\' 1"  (1.549 m)   Wt 81.2 kg   LMP 01/04/2016 (Exact Date)   SpO2 100%   BMI 33.82 kg/m   Physical Exam  Constitutional: She appears well-developed and well-nourished.  HENT:  Head: Normocephalic and atraumatic.  Eyes: Conjunctivae are normal.  Neck: Normal range of motion.  Cardiovascular: Normal rate, regular rhythm, normal heart sounds and intact distal pulses.   Pulmonary/Chest: Effort normal and breath sounds normal. She has no wheezes.  Abdominal: Soft. Bowel sounds are normal. She exhibits no mass. There is no tenderness. There is no guarding.  Genitourinary: Vagina normal. Uterus is enlarged. Uterus is not tender. Cervix exhibits no discharge. No bleeding in the vagina.  Genitourinary Comments: Positive chadwicks of cervix.  Trace blood  in vagina, no active bleeding.  Musculoskeletal: Normal range of motion.  Neurological: She is alert.  Skin: Skin is warm and dry.  Psychiatric: She has a normal mood and affect.  Nursing note and vitals reviewed.    ED Treatments / Results  Labs (all labs ordered are listed, but only abnormal results are displayed) Labs Reviewed  BASIC METABOLIC PANEL - Abnormal; Notable for the following:       Result Value   Potassium 3.3 (*)    Calcium 8.7 (*)    All other components within normal limits  HCG, QUANTITATIVE, PREGNANCY - Abnormal; Notable for the following:    hCG, Beta Chain, Quant, S 70,899 (*)    All other components within normal limits  CBC WITH DIFFERENTIAL/PLATELET  URINALYSIS, ROUTINE W REFLEX  MICROSCOPIC    EKG  EKG Interpretation None       Radiology No results found.  Procedures Procedures (including critical care time)  Medications Ordered in ED Medications - No data to display   Initial Impression / Assessment and Plan / ED Course  I have reviewed the triage vital signs and the nursing notes.  Pertinent labs & imaging results that were available during my care of the patient were reviewed by me and considered in my medical decision making (see chart for details).     Labs reviewed, no anemia, quantitative hcg within expected range for dates.  Review of prior labs document pt is rh+ , therefore not repeated tonight. Advised repeat quant hcg in 3 days, [facility] [place].  She was advised recheck immediately for any return or worsened pain/bleeding, weakness.  Discussed possibility of threatened abortion, although this could be normal bleeding with her known subchorionic hemorrhage (review of chart confirmed this finding on recent US).  The patient appears reasonably screened and/or stabilized for discharge and I doubt any other medical condition or other [facility] requiring further screening, evaluation, or treatment in the ED at this time prior to discharge.   Final Clinical Impressions(s) / ED Diagnoses   Final diagnoses:  Vaginal bleeding in pregnancy    New Prescriptions New Prescriptions   No medications on file     Burgess AmorJulie Wahid Holley, Cordelia Poche-C 03/05/16 0116    Shon Batonourtney F Horton, MD 03/06/16 [phone]

## 2016-03-04 NOTE — ED Triage Notes (Signed)
Pt reports that she is [redacted] weeks pregnant and [place] [facility] [place] Reports bleeding since 12 n

## 2016-03-05 LAB — CBC WITH DIFFERENTIAL/PLATELET
Basophils Absolute: 0 10*3/uL (ref 0.0–0.1)
Basophils Relative: 0 %
Eosinophils Absolute: 0.1 10*3/uL (ref 0.0–0.7)
Eosinophils Relative: 2 %
HCT: 36.7 % (ref 36.0–46.0)
Hemoglobin: 13.2 g/dL (ref 12.0–15.0)
Lymphocytes Relative: 23 %
Lymphs Abs: 2.1 10*3/uL (ref 0.7–4.0)
MCH: 31.1 pg (ref 26.0–34.0)
MCHC: 36 g/dL (ref 30.0–36.0)
MCV: 86.4 fL (ref 78.0–100.0)
Monocytes Absolute: 0.7 10*3/uL (ref 0.1–1.0)
Monocytes Relative: 8 %
Neutro Abs: 6 10*3/uL (ref 1.7–7.7)
Neutrophils Relative %: 67 %
Platelets: 236 10*3/uL (ref 150–400)
RBC: 4.25 MIL/uL (ref 3.87–5.11)
RDW: 12.6 % (ref 11.5–15.5)
WBC: 9 10*3/uL (ref 4.0–10.5)

## 2016-03-05 LAB — BASIC METABOLIC PANEL
ANION GAP: 6 (ref 5–15)
BUN: 14 mg/dL (ref 6–20)
CO2: 25 mmol/L (ref 22–32)
Calcium: 8.7 mg/dL — ABNORMAL LOW (ref 8.9–10.3)
Chloride: 105 mmol/L (ref 101–111)
Creatinine, Ser: 0.47 mg/dL (ref 0.44–1.00)
GFR calc Af Amer: >60 mL/min (ref >60)
Glucose, Bld: 88 mg/dL (ref 65–99)
Potassium: 3.3 mmol/L — ABNORMAL LOW (ref 3.5–5.1)
SODIUM: 136 mmol/L (ref 135–145)

## 2016-03-05 LAB — HCG, QUANTITATIVE, PREGNANCY: HCG, BETA CHAIN, QUANT, S: 70899 m[IU]/mL — AB (ref <5)

## 2016-03-05 NOTE — ED Notes (Signed)
BP 85/59 before getting dressed and walking around, BP after 108/62.

## 2016-03-05 NOTE — Discharge Instructions (Signed)
As discussed you will need to have a repeat blood test called a quantitative hCG on Monday to make sure this number is continuing to rise - it is 70,899 today.  If you develop worsening bleeding, pelvic cramping, weakness or dizziness, get rechecked immediately either by returning here or going [place] where your ob gyn would be able to assess you.  Minimize activity and refer to the pelvic rest instructions until Dr. Donzetta MattersGalloway says it is ok to return to normal activities.

## 2016-03-05 NOTE — ED Notes (Signed)
Pt given grape juice and water

## 2016-04-14 ENCOUNTER — Emergency Department (HOSPITAL_COMMUNITY)
Admission: EM | Admit: 2016-04-14 | Discharge: 2016-04-14 | Disposition: A | Discharge status: Home / Self Care | Payer: Medicaid Other | Attending: Emergency Medicine | Admitting: Emergency Medicine

## 2016-04-14 ENCOUNTER — Encounter (HOSPITAL_COMMUNITY): Payer: Self-pay

## 2016-04-14 DIAGNOSIS — J069 Acute upper respiratory infection, unspecified: Secondary | ICD-10-CM | POA: Diagnosis not present

## 2016-04-14 DIAGNOSIS — R51 Headache: Secondary | ICD-10-CM | POA: Diagnosis present

## 2016-04-14 DIAGNOSIS — J0110 Acute frontal sinusitis, unspecified: Secondary | ICD-10-CM

## 2016-04-14 HISTORY — DX: Migraine, unspecified, not intractable, without status migrainosus: G43.909

## 2016-04-14 LAB — RAPID STREP SCREEN (MED CTR MEBANE ONLY): STREPTOCOCCUS, GROUP A SCREEN (DIRECT): NEGATIVE

## 2016-04-14 MED ORDER — LORATADINE 10 MG PO TABS: 10.0000 mg | ORAL_TABLET | Freq: Every day | ORAL | 0 refills | Status: AC

## 2016-04-14 MED ORDER — FLUTICASONE PROPIONATE 50 MCG/ACT NA SUSP: 2.0000 | Freq: Every day | NASAL | 0 refills | Status: AC

## 2016-04-14 NOTE — ED Triage Notes (Signed)
Pt c/o headache, sore throat and low grade fever onset this am.  Pt also c/o a burning type of pain to her right side/flank.

## 2016-04-14 NOTE — ED Provider Notes (Signed)
[facility]-EMER[facility]ENCY DEPT Provider Note   CSN: [id number] Arrival date & time: 04/14/16  0446     History   Chief Complaint Chief Complaint  Patient presents with  . Headache  . Sore Throat    HPI Sandra Frost is a 26 y.o. female.  HPI 26 year old female [redacted] weeks pregnant presents with left frontal headache, nasal congestion sore throat, and cough for the past day. Denies photophobia, nausea or vomiting. Cough is nonproductive and denies shortness of breath. She can Tylenol at home for pain. Patient also complains of right flank pain. She describes this as burning in nature. No new rashes or injury. No urinary symptoms. No abdominal pain. No vaginal bleeding or discharge. Past Medical History:  Diagnosis Date  . Bradycardia 2012   one episode, 3-day hospitalization, no Rx  . Headache   . History of mastitis 08/27/2014  . Infection    UTI  . Mastitis, right, acute 08/20/2014  . Migraine   . Ovarian cyst     Patient Active Problem List   Diagnosis Date Noted  . History of mastitis 08/27/2014  . Mastitis, right, acute 08/20/2014    History reviewed. No pertinent surgical history.  OB History    Gravida Para Term Preterm AB Living   0 1 4   SAB TAB Ectopic Multiple Live Births   1 0 0 0 4       Home Medications    Prior to Admission medications   Medication Sig Start Date End Date Taking? Authorizing Provider  Prenatal Vit-Fe Fumarate-FA (PRENATAL MULTIVITAMIN) TABS tablet Take 1 tablet by mouth daily at 12 noon.   Yes Historical Provider, MD  fluticasone (FLONASE) 50 MCG/ACT nasal spray Place 2 sprays into both nostrils daily. 04/14/16   Loren Racer, MD  loratadine (CLARITIN) 10 MG tablet Take 1 tablet (10 mg total) by mouth daily. 04/14/16   Loren Racer, MD    Family History Family History  Problem Relation Age of Onset  . Diabetes Mother   . Seizures Father   . Heart disease Father   . Asthma Daughter   . Cancer Maternal Grandmother     breast    . Stroke Maternal Grandfather   . Cancer Paternal Grandmother     breast  . Heart disease Paternal Grandmother     Social History Social History  Substance Use Topics  . Smoking status: Never Smoker  . Smokeless tobacco: Never Used  . Alcohol use No     Allergies   Penicillins   Review of Systems Review of Systems  Constitutional: Positive for fever. Negative for chills.  HENT: Positive for congestion, sinus pain, sinus pressure and sore throat. Negative for facial swelling.   Eyes: Negative for pain and visual disturbance.  Respiratory: Positive for cough. Negative for shortness of breath and wheezing.   Cardiovascular: Negative for chest pain.  Gastrointestinal: Negative for abdominal pain, nausea and vomiting.  Genitourinary: Positive for flank pain. Negative for difficulty urinating, dysuria, frequency, hematuria, pelvic pain, vaginal bleeding and vaginal discharge.  Musculoskeletal: Positive for myalgias.  Neurological: Negative for dizziness, weakness, light-headedness, numbness and headaches.  All other systems reviewed and are negative.    Physical Exam Updated Vital Signs BP (!) 116/98 (BP Location: Right Arm)   Pulse 89   Temp 99.5 F (37.5 C) (Oral)   Resp 16   Ht  (1.575 m)   Wt 180 lb (81.6 kg)   LMP 01/03/2016   SpO2 98%  BMI 32.92 kg/m   Physical Exam  Constitutional: She is oriented to person, place, and time. She appears well-developed and well-nourished. No distress.  HENT:  Head: Normocephalic and atraumatic.  Mouth/Throat: Oropharynx is clear and moist.  Bilateral nasal mucosal edema. Patient has left frontal sinus tenderness with percussion. Oropharynx is mildly erythematous. No tonsillar exudates. Uvula is midline.  Eyes: EOM are normal. Pupils are equal, round, and reactive to light.  Neck: Normal range of motion. Neck supple.  No meningismus  Cardiovascular: Normal rate and regular rhythm.  Exam reveals no gallop and no friction  rub.   No murmur heard. Pulmonary/Chest: Effort normal and breath sounds normal. No respiratory distress. She has no wheezes. She has no rales. She exhibits no tenderness.  Abdominal: Soft. Bowel sounds are normal. There is no tenderness. There is no rebound and no guarding.  Musculoskeletal: Normal range of motion. She exhibits tenderness. She exhibits no edema.  Mild right-sided paraspinal lumbar tenderness. No midline tenderness. No definite CVA tenderness.  Lymphadenopathy:    She has no cervical adenopathy.  Neurological: She is alert and oriented to person, place, and time.  5/5 motor in all extremities. Sensation fully intact.  Skin: Skin is warm and dry. Capillary refill takes less than 2 seconds. No rash noted. No erythema.  Psychiatric: She has a normal mood and affect. Her behavior is normal.  Nursing note and vitals reviewed.    ED Treatments / Results  Labs (all labs ordered are listed, but only abnormal results are displayed) Labs Reviewed  RAPID STREP SCREEN (NOT AT [facility])    EKG  EKG Interpretation None       Radiology No results found.  Procedures Procedures (including critical care time)  Medications Ordered in ED Medications - No data to display   Initial Impression / Assessment and Plan / ED Course  I have reviewed the triage vital signs and the nursing notes.  Pertinent labs & imaging results that were available during my care of the patient were reviewed by me and considered in my medical decision making (see chart for details).     Strep is negative. Likely viral URI and sinusitis. Lungs are clear and patient is maintaining saturations in the high 90s. Do not believe that emergent imaging is necessary at this time. We'll start on antihistamine and nasal steroid. Advised continued Tylenol for fever or chills. Follow-up with primary physician and return precautions given.  Final Clinical Impressions(s) / ED Diagnoses   Final diagnoses:  Acute  frontal sinusitis, recurrence not specified  Upper respiratory tract infection, unspecified type    New Prescriptions Discharge Medication List as of 04/14/2016  5:30 AM    START taking these medications   Details  fluticasone (FLONASE) 50 MCG/ACT nasal spray Place 2 sprays into both nostrils daily., Starting Thu 04/14/2016, Print    loratadine (CLARITIN) 10 MG tablet Take 1 tablet (10 mg total) by mouth daily., Starting Thu 04/14/2016, Print         Loren Racer, MD 04/14/16 [phone]

## 2016-04-15 ENCOUNTER — Encounter (HOSPITAL_COMMUNITY): Payer: Self-pay | Admitting: Emergency Medicine

## 2016-04-15 ENCOUNTER — Emergency Department (HOSPITAL_COMMUNITY): Payer: Medicaid Other

## 2016-04-15 ENCOUNTER — Emergency Department (HOSPITAL_COMMUNITY)
Admission: EM | Admit: 2016-04-15 | Discharge: 2016-04-15 | Disposition: A | Discharge status: Home / Self Care | Payer: Medicaid Other | Attending: Emergency Medicine | Admitting: Emergency Medicine

## 2016-04-15 DIAGNOSIS — R059 Cough, unspecified: Secondary | ICD-10-CM

## 2016-04-15 DIAGNOSIS — R509 Fever, unspecified: Secondary | ICD-10-CM

## 2016-04-15 DIAGNOSIS — O26892 Other specified pregnancy related conditions, second trimester: Secondary | ICD-10-CM | POA: Insufficient documentation

## 2016-04-15 DIAGNOSIS — R61 Generalized hyperhidrosis: Secondary | ICD-10-CM | POA: Insufficient documentation

## 2016-04-15 DIAGNOSIS — R05 Cough: Secondary | ICD-10-CM | POA: Insufficient documentation

## 2016-04-15 DIAGNOSIS — R824 Acetonuria: Secondary | ICD-10-CM | POA: Diagnosis not present

## 2016-04-15 DIAGNOSIS — R51 Headache: Secondary | ICD-10-CM | POA: Insufficient documentation

## 2016-04-15 DIAGNOSIS — Z3A15 15 weeks gestation of pregnancy: Secondary | ICD-10-CM | POA: Diagnosis not present

## 2016-04-15 LAB — URINALYSIS, ROUTINE W REFLEX MICROSCOPIC
Bilirubin Urine: NEGATIVE
GLUCOSE, UA: NEGATIVE mg/dL
Hgb urine dipstick: NEGATIVE
Ketones, ur: 80 mg/dL — AB
Nitrite: NEGATIVE
PROTEIN: NEGATIVE mg/dL
Specific Gravity, Urine: 1.027 (ref 1.005–1.030)
pH: 5 (ref 5.0–8.0)

## 2016-04-15 MED ORDER — DEXAMETHASONE 4 MG PO TABS
6.0000 mg | ORAL_TABLET | Freq: Once | ORAL | Status: AC
  Administered 2016-04-15: 6 mg via ORAL
  Filled 2016-04-15: qty 2

## 2016-04-15 MED ORDER — ACETAMINOPHEN 325 MG PO TABS
650.0000 mg | ORAL_TABLET | Freq: Once | ORAL | Status: AC
  Administered 2016-04-15: 650 mg via ORAL
  Filled 2016-04-15: qty 2

## 2016-04-15 MED ORDER — KCL IN DEXTROSE-NACL 20-5-0.45 MEQ/L-%-% IV SOLN
Freq: Once | INTRAVENOUS | Status: AC
  Administered 2016-04-15: 11:00:00 via INTRAVENOUS
  Filled 2016-04-15: qty 1000

## 2016-04-15 MED ORDER — ACETAMINOPHEN 325 MG PO TABS
325.0000 mg | ORAL_TABLET | Freq: Once | ORAL | Status: AC
  Administered 2016-04-15: 325 mg via ORAL
  Filled 2016-04-15: qty 1

## 2016-04-15 NOTE — ED Provider Notes (Addendum)
[facility] Provider Note   CSN: [id number] Arrival date & time: 04/15/16  7253  By signing my name below, I, Majel Homer, attest that this documentation has been prepared under the direction and in the presence of Gerhard Munch, MD . Electronically Signed: Majel Homer, Scribe. 04/15/2016. 8:56 AM.  History   Chief Complaint Chief Complaint  Patient presents with  . Cough    [redacted] weeks pregnant  . Chest Pain    with cough and breathing   The history is provided by the patient. No language interpreter was used.   HPI Comments: Sandra Frost is a 26 y.o. female currently [redacted] weeks pregnant, who presents to the Emergency Department complaining of gradually worsening, cough and chest pain secondary to her cough that began 3 days ago. Pt reports she "tastes blood" when she coughs which is why she visited the ED today. She states associated fever, chills, headache, and one episode of night sweats that occurred last night. She notes she visited [facility] ED yesterday for similar symptoms in which she was diagnosed with a sinusitis and prescribed Loratadine and Flonase with no relief. Pt reports her last follow-up appointment with her Ob/Gyn was on 3/21 with no complications. She denies any abnormal vaginal bleeding, vaginal discharge, abdominal cramping, loss of consciousness, or confusion.   Past Medical History:  Diagnosis Date  . Bradycardia 2012   one episode, 3-day hospitalization, no Rx  . Headache   . History of mastitis 08/27/2014  . Infection    UTI  . Mastitis, right, acute 08/20/2014  . Migraine   . Ovarian cyst     Patient Active Problem List   Diagnosis Date Noted  . History of mastitis 08/27/2014  . Mastitis, right, acute 08/20/2014    No past surgical history on file.  OB History    Gravida Para Term Preterm AB Living   0 1 4   SAB TAB Ectopic Multiple Live Births   1 0 0 0 4     Home Medications    Prior to Admission medications   Medication Sig Start  Date End Date Taking? Authorizing Provider  fluticasone (FLONASE) 50 MCG/ACT nasal spray Place 2 sprays into both nostrils daily. 04/14/16   Loren Racer, MD  loratadine (CLARITIN) 10 MG tablet Take 1 tablet (10 mg total) by mouth daily. 04/14/16   Loren Racer, MD  Prenatal Vit-Fe Fumarate-FA (PRENATAL MULTIVITAMIN) TABS tablet Take 1 tablet by mouth daily at 12 noon.    Historical Provider, MD    Family History Family History  Problem Relation Age of Onset  . Diabetes Mother   . Seizures Father   . Heart disease Father   . Asthma Daughter   . Cancer Maternal Grandmother     breast  . Stroke Maternal Grandfather   . Cancer Paternal Grandmother     breast  . Heart disease Paternal Grandmother     Social History Social History  Substance Use Topics  . Smoking status: Never Smoker  . Smokeless tobacco: Never Used  . Alcohol use No   Allergies   Penicillins  Review of Systems Review of Systems  Constitutional: Positive for chills, diaphoresis and fever.  Respiratory: Positive for cough.   Cardiovascular: Positive for chest pain (secondary to cough ).  Genitourinary: Negative for vaginal bleeding and vaginal discharge.  Neurological: Positive for headaches. Negative for syncope.  Psychiatric/Behavioral: Negative for confusion.  All other systems reviewed and are negative.  Physical Exam Updated Vital Signs  BP 113/78 (BP Location: Right Arm)   Pulse 87   Temp 99.2 F (37.3 C) (Oral)   Resp 20   Ht  (1.549 m)   Wt 180 lb (81.6 kg)   LMP 01/04/2016 (Exact Date)   SpO2 100%   BMI 34.01 kg/m   Physical Exam  Pulmonary/Chest: She has no wheezes.  Abdominal: She exhibits no distension. There is no tenderness.    ED Treatments / Results  DIAGNOSTIC STUDIES:  Oxygen Saturation is 100% on RA, normal by my interpretation.    COORDINATION OF CARE:  8:41 AM Discussed treatment plan with pt at bedside and pt agreed to plan.  Labs (all labs ordered are  listed, but only abnormal results are displayed) Labs Reviewed  URINALYSIS, ROUTINE W REFLEX MICROSCOPIC - Abnormal; Notable for the following:       Result Value   APPearance HAZY (*)    Ketones, ur 80 (*)    Leukocytes, UA TRACE (*)    Bacteria, UA RARE (*)    Squamous Epithelial / LPF 6-30 (*)    All other components within normal limits     Radiology Dg Chest 2 View  Result Date: 04/15/2016 CLINICAL DATA:  Headache sore throat. Low-grade fever. Central chest pain. Cough and possible hemoptysis. EXAM: CHEST  2 VIEW COMPARISON:  Two-view chest x-ray 01/16/2010 FINDINGS: Mild interstitial prominence is noted. There is no focal airspace consolidation. No edema or effusion is present. The visualized soft tissues and bony thorax are unremarkable. IMPRESSION: 1. Mild interstitial coarsening may reflect a viral process or reactive airways disease. 2. No significant airspace consolidation or pneumonia. Electronically Signed   By: Marin Roberts M.D.   On: 04/15/2016 09:56   Procedures Procedures (including critical care time)  Medications Ordered in ED Medications - No data to display  Initial Impression / Assessment and Plan / ED Course  I have reviewed the triage vital signs and the nursing notes.  Pertinent labs & imaging results that were available during my care of the patient were reviewed by me and considered in my medical decision making (see chart for details).   I personally performed the services described in this documentation, which was scribed in my presence. The recorded information has been reviewed and is accurate.    On repeat exam the patient is awake and alert, in no distress. I discussed all findings with her including reassuring x-ray, with some suspicion for bronchitis. Patient confirms that her daughter has had similar URI like illness, bronchitis, has recently completed a course of steroid therapy. With urinalysis notable for ketonuria, and her ongoing  pregnancy, we discussed the need for fluid resuscitation. Patient opted for IV resuscitation. This is reasonable given her early pregnancy status. Patient will receive initial medication for her bronchitis as well.   Final Clinical Impressions(s) / ED Diagnoses  Cough Pearline Cables, MD 04/15/16 1035    Gerhard Munch, MD 04/28/16 [phone]

## 2016-04-15 NOTE — Discharge Instructions (Signed)
As discussed, it is very important that you monitor your condition carefully, continue to take the previously prescribed medication, and be sure to follow-up with your obstetrician.  Return here for concerning changes in your condition.

## 2016-04-15 NOTE — ED Triage Notes (Signed)
Pt reports cough x 3days, burning pain in chest with breathing and coughing, [redacted] weeks pregnant, no distress noted at this time

## 2016-04-16 LAB — CULTURE, GROUP A STREP (THRC)

## 2017-01-27 ENCOUNTER — Emergency Department (HOSPITAL_COMMUNITY)
Admission: EM | Admit: 2017-01-27 | Discharge: 2017-01-27 | Disposition: A | Discharge status: Home / Self Care | Payer: Self-pay

## 2017-01-27 ENCOUNTER — Emergency Department
Admission: EM | Admit: 2017-01-27 | Discharge: 2017-01-27 | Disposition: A | Discharge status: Home / Self Care | Payer: Self-pay | Attending: Emergency Medicine | Admitting: Emergency Medicine

## 2017-01-27 ENCOUNTER — Emergency Department: Payer: Self-pay

## 2017-01-27 ENCOUNTER — Encounter: Payer: Self-pay | Admitting: Emergency Medicine

## 2017-01-27 DIAGNOSIS — J0100 Acute maxillary sinusitis, unspecified: Secondary | ICD-10-CM | POA: Insufficient documentation

## 2017-01-27 DIAGNOSIS — J320 Chronic maxillary sinusitis: Secondary | ICD-10-CM

## 2017-01-27 DIAGNOSIS — R509 Fever, unspecified: Secondary | ICD-10-CM

## 2017-01-27 LAB — CBC WITH DIFFERENTIAL/PLATELET
BASOS PCT: 0 %
Basophils Absolute: 0 10*3/uL (ref 0–0.1)
Eosinophils Absolute: 0.2 10*3/uL (ref 0–0.7)
Eosinophils Relative: 2 %
HEMATOCRIT: 41.8 % (ref 35.0–47.0)
Hemoglobin: 14.2 g/dL (ref 12.0–16.0)
LYMPHS ABS: 1.6 10*3/uL (ref 1.0–3.6)
LYMPHS PCT: 13 %
MCH: 29.1 pg (ref 26.0–34.0)
MCHC: 34 g/dL (ref 32.0–36.0)
MCV: 85.7 fL (ref 80.0–100.0)
MONOS PCT: 6 %
Monocytes Absolute: 0.7 10*3/uL (ref 0.2–0.9)
NEUTROS ABS: 9.2 10*3/uL — AB (ref 1.4–6.5)
Neutrophils Relative %: 79 %
Platelets: 269 10*3/uL (ref 150–440)
RBC: 4.88 MIL/uL (ref 3.80–5.20)
RDW: 13.2 % (ref 11.5–14.5)
WBC: 11.7 10*3/uL — ABNORMAL HIGH (ref 3.6–11.0)

## 2017-01-27 LAB — INFLUENZA PANEL BY PCR (TYPE A & B)
INFLBPCR: NEGATIVE
Influenza A By PCR: NEGATIVE

## 2017-01-27 LAB — COMPREHENSIVE METABOLIC PANEL
ALBUMIN: 4.3 g/dL (ref 3.5–5.0)
ALK PHOS: 77 U/L (ref 38–126)
ALT: 17 U/L (ref 14–54)
ANION GAP: 8 (ref 5–15)
AST: 16 U/L (ref 15–41)
BILIRUBIN TOTAL: 0.7 mg/dL (ref 0.3–1.2)
BUN: 18 mg/dL (ref 6–20)
CALCIUM: 8.9 mg/dL (ref 8.9–10.3)
CO2: 27 mmol/L (ref 22–32)
Chloride: 104 mmol/L (ref 101–111)
Creatinine, Ser: 0.7 mg/dL (ref 0.44–1.00)
GFR calc Af Amer: >60 mL/min (ref >60)
GLUCOSE: 94 mg/dL (ref 65–99)
POTASSIUM: 4 mmol/L (ref 3.5–5.1)
Sodium: 139 mmol/L (ref 135–145)
TOTAL PROTEIN: 7.5 g/dL (ref 6.5–8.1)

## 2017-01-27 LAB — TSH: TSH: 0.38 u[IU]/mL (ref 0.350–4.500)

## 2017-01-27 LAB — POCT PREGNANCY, URINE: PREG TEST UR: NEGATIVE

## 2017-01-27 MED ORDER — IOPAMIDOL (ISOVUE-300) INJECTION 61%
75.0000 mL | Freq: Once | INTRAVENOUS | Status: AC | PRN
  Administered 2017-01-27: 75 mL via INTRAVENOUS

## 2017-01-27 MED ORDER — AMOXICILLIN-POT CLAVULANATE 875-125 MG PO TABS: 1.0000 | ORAL_TABLET | Freq: Two times a day (BID) | ORAL | 0 refills | Status: AC

## 2017-01-27 MED ORDER — AMOXICILLIN-POT CLAVULANATE 875-125 MG PO TABS
1.0000 | ORAL_TABLET | Freq: Once | ORAL | Status: AC
  Administered 2017-01-27: 1 via ORAL
  Filled 2017-01-27: qty 1

## 2017-01-27 MED ORDER — SODIUM CHLORIDE 0.9 % IV BOLUS (SEPSIS)
1000.0000 mL | Freq: Once | INTRAVENOUS | Status: AC
  Administered 2017-01-27: 1000 mL via INTRAVENOUS

## 2017-01-27 MED ORDER — ACETAMINOPHEN 325 MG PO TABS
650.0000 mg | ORAL_TABLET | Freq: Once | ORAL | Status: AC | PRN
  Administered 2017-01-27: 650 mg via ORAL
  Filled 2017-01-27: qty 2

## 2017-01-27 NOTE — ED Notes (Signed)
Signature pad not working at time of discharge; pt expresses verbal understanding of discharge instructions with no further questions at this time. 

## 2017-01-27 NOTE — ED Triage Notes (Signed)
Pt comes into the ED via POV c/o facial pain above the left eye with some minimal numbness around the left side of her lips.  Patient able to smile with no droop noticeable and able to lift eyebrows.  Patient states this started 3 days ago.  Patient is neurologically intact at this time and in NAD with even and unlabored respirations.

## 2017-01-27 NOTE — ED Notes (Signed)
Patient transported to CT 

## 2017-01-27 NOTE — ED Provider Notes (Signed)
[facility] Emergency Department Provider Note  ____________________________________________   First MD Initiated Contact with Patient 01/27/17 2031     (approximate)  I have reviewed the triage vital signs and the nursing notes.   HISTORY  Chief Complaint Facial Pain   HPI Sandra Frost is a 27 y.o. female with a history of migraine headaches as well as being 3 months postpartum and still breast-feeding who is presenting to the emergency department today with left-sided headache over the past 3 days as well as dizziness and vomiting.  She says today at 1 PM she felt the left side of her face went numb and then over the next hour to hour and a half feeling came back but there was more pain than numbness to the left side of the face radiating down through her neck on the left side.  She denies pain with movement of her eyes.  Denies sore throat.  Denies pain in her teeth.  Denies any cough or runny nose.  No known sick contacts.  Says that she has had headache on the left temple over the past 3 nights but the numbness today is what made her decide to come to the hospital for further evaluation.   Past Medical History:  Diagnosis Date  . Bradycardia 2012   one episode, 3-day hospitalization, no Rx  . Headache   . History of mastitis 08/27/2014  . Infection    UTI  . Mastitis, right, acute 08/20/2014  . Migraine   . Ovarian cyst     Patient Active Problem List   Diagnosis Date Noted  . History of mastitis 08/27/2014  . Mastitis, right, acute 08/20/2014    History reviewed. No pertinent surgical history.  Prior to Admission medications   Medication Sig Start Date End Date Taking? Authorizing Provider  fluticasone (FLONASE) 50 MCG/ACT nasal spray Place 2 sprays into both nostrils daily. 04/14/16   Loren Racer, MD  loratadine (CLARITIN) 10 MG tablet Take 1 tablet (10 mg total) by mouth daily. 04/14/16   Loren Racer, MD  Prenatal Vit-Fe  Fumarate-FA (PRENATAL MULTIVITAMIN) TABS tablet Take 1 tablet by mouth daily at 12 noon.    [provider]    Allergies Penicillins  Family History  Problem Relation Age of Onset  . Diabetes Mother   . Seizures Father   . Heart disease Father   . Asthma Daughter   . Cancer Maternal Grandmother        breast  . Stroke Maternal Grandfather   . Cancer Paternal Grandmother        breast  . Heart disease Paternal Grandmother     Social History Social History   Tobacco Use  . Smoking status: Never Smoker  . Smokeless tobacco: Never Used  Substance Use Topics  . Alcohol use: No  . Drug use: No    Review of Systems  Constitutional: No fever/chills Eyes: No visual changes. ENT: No sore throat. Cardiovascular: Denies chest pain. Respiratory: Denies shortness of breath. Gastrointestinal: No abdominal pain.   No diarrhea.  No constipation. Genitourinary: Negative for dysuria. Musculoskeletal: Negative for back pain. Skin: Negative for rash. Neurological: Negative for focal weakness or numbness.   ____________________________________________   PHYSICAL EXAM:  VITAL SIGNS: ED Triage Vitals  Enc Vitals Group     BP 01/27/17 1906 128/71     Pulse Rate 01/27/17 1906 (!) 121     Resp 01/27/17 1906 18     Temp 01/27/17 1906 (!) 103 F (  39.4 C)     Temp Source 01/27/17 1906 Oral     SpO2 01/27/17 1906 99 %     Weight 01/27/17 1907 200 lb (90.7 kg)     Height 01/27/17 1907 5\' 2"  (1.575 m)     Head Circumference --      Peak Flow --      Pain Score 01/27/17 1907 10     Pain Loc --      Pain Edu? --      Excl. in GC? --     Constitutional: Alert and oriented. Well appearing and in no acute distress. Eyes: Conjunctivae are normal.  Extraocular muscles are intact. Head: Atraumatic.  Tenderness to palpation of the left temple extending down through the left side of the neck.  No swelling noted.  No erythema or induration. Nose: No  congestion/rhinnorhea. Mouth/Throat: Mucous membranes are moist.  No swelling to the uvula nor bilateral tonsils.  No erythema to the pharynx.  Good dentition without tooth erosion or swelling to the gumline.  No significant tartar buildup. Neck: No stridor.  Range his head freely.  No meningismus.  Tenderness to palpation along the distribution of the left internal jugular vein. Cardiovascular: Normal rate, regular rhythm. Grossly normal heart sounds.   Respiratory: Normal respiratory effort.  No retractions. Lungs CTAB. Gastrointestinal: Soft and nontender. No distention.  Musculoskeletal: No lower extremity tenderness nor edema.  No joint effusions. Neurologic:  Normal speech and language. No gross focal neurologic deficits are appreciated. Skin:  Skin is warm, dry and intact. No rash noted. Psychiatric: Mood and affect are normal. Speech and behavior are normal.  ____________________________________________   LABS (all labs ordered are listed, but only abnormal results are displayed)  Labs Reviewed  CBC WITH DIFFERENTIAL/PLATELET - Abnormal; Notable for the following components:      Result Value   WBC 11.7 (*)    Neutro Abs 9.2 (*)    All other components within normal limits  COMPREHENSIVE METABOLIC PANEL  INFLUENZA PANEL BY PCR (TYPE A & B)  TSH  POC URINE PREG, ED  POCT PREGNANCY, URINE   ____________________________________________  EKG  ED ECG REPORT I, Arelia Longestavid M Schaevitz, the attending physician, personally viewed and interpreted this ECG.   Date: 01/27/2017  EKG Time: 1911  Rate: 101  Rhythm: sinus tachycardia  Axis: Normal  Intervals:none  ST&T Change: No ST segment elevation or depression.  Single T wave inversion in V3.  ____________________________________________  RADIOLOGY  CT head wih significant left maxillary mucosal thickening.  CT venogram without dural venous sinus thrombosis.  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  Otherwise normal CT of the neck. ____________________________________________   PROCEDURES  Procedure(s) performed:   Procedures  Critical Care performed:   ____________________________________________   INITIAL IMPRESSION / ASSESSMENT AND PLAN / ED COURSE  Pertinent labs & imaging results that were available during my care of the patient were reviewed by me and considered in my medical decision making (see chart for details).  Differential diagnosis includes, but is not limited to, intracranial hemorrhage, meningitis/encephalitis, previous head trauma, cavernous venous thrombosis, tension headache, temporal arteritis, migraine or migraine equivalent, idiopathic intracranial hypertension, and non-specific headache.  Sinus infection, venous thrombosis influenza As part of my medical decision making, I reviewed the following data within the electronic MEDICAL RECORD NUMBER Notes from prior ED visits  ----------------------------------------- 10:32 PM on 01/27/2017 -----------------------------------------  The patient  has defervesced with a normal temperature and heart rate at this time.  Continues to have tenderness to palpation over the left temporal region as well as anteriorly over the left maxillary sinus anteriorly.  Likely sinus disease causing issue tonight.  Patient is awake and alert and well-appearing.  Unlikely to be meningitis.  No meningeal signs.  No neck pain.  Flu negative.  Patient says that she tolerates amoxicillin despite having a listed allergy to penicillins.  Will be discharged with Augmentin.  She will follow-up with primary care.  She is understanding of the treatment plan willing to comply.  To return for any worsening or concerning symptoms.      ____________________________________________   FINAL CLINICAL IMPRESSION(S) / ED DIAGNOSES  Fever.  Sinusitis.    NEW MEDICATIONS STARTED  DURING THIS VISIT:  New Prescriptions   No medications on file     Note:  This document was prepared using Dragon voice recognition software and may include unintentional dictation errors.     Myrna Blazer, MD 01/27/17 [phone]

## 2017-01-27 NOTE — ED Notes (Signed)
Patient called x3 for triage, no answer. 

## 2017-01-27 NOTE — ED Triage Notes (Signed)
[facility] left facial pain x 3 days.  Reports that earlier today entire left side of face was numb, but now just left cheek and jaw are numb.  Also c/o generalized body chills.  Facial movement equal.  NAD

## 2018-12-19 ENCOUNTER — Other Ambulatory Visit: Payer: Self-pay

## 2018-12-19 ENCOUNTER — Telehealth: Payer: Self-pay | Admitting: *Deleted

## 2018-12-19 DIAGNOSIS — O4690 Antepartum hemorrhage, unspecified, unspecified trimester: Secondary | ICD-10-CM

## 2018-12-19 NOTE — Telephone Encounter (Signed)
Pt started spotting last night. + cramping this am. Bright red blood, off and on. Has not had sex or BM recently. Scheduled for a dating [place] on Jan.5. Do you want her to have a quant? Thanks!! [place]

## 2018-12-19 NOTE — Telephone Encounter (Signed)
Pt advised of JAG's recommendations and will go to [facility] this afternoon. Advised pelvic rest and no sex for 7 days from the last time she wipes any color. Pt voiced understanding. [place]

## 2018-12-20 LAB — BETA HCG QUANT (REF LAB): hCG Quant: 5160 m[IU]/mL

## 2019-01-07 ENCOUNTER — Other Ambulatory Visit: Payer: Self-pay | Admitting: Obstetrics & Gynecology

## 2019-01-07 DIAGNOSIS — O3680X0 Pregnancy with inconclusive fetal viability, not applicable or unspecified: Secondary | ICD-10-CM

## 2019-01-08 ENCOUNTER — Other Ambulatory Visit: Payer: Self-pay

## 2019-01-08 ENCOUNTER — Ambulatory Visit (INDEPENDENT_AMBULATORY_CARE_PROVIDER_SITE_OTHER): Payer: Medicaid Other

## 2019-01-08 DIAGNOSIS — Z3A08 8 weeks gestation of pregnancy: Secondary | ICD-10-CM | POA: Diagnosis not present

## 2019-01-08 DIAGNOSIS — O3680X0 Pregnancy with inconclusive fetal viability, not applicable or unspecified: Secondary | ICD-10-CM | POA: Diagnosis not present

## 2019-01-08 NOTE — Progress Notes (Signed)
[place] 8+2 wks,single IUP w/ys,positive fht 178 bpm,normal ovaries,subchorionic hemorrhage 3.2 x 1.7 x .8 cm

## 2019-02-06 ENCOUNTER — Other Ambulatory Visit: Payer: Self-pay | Admitting: Obstetrics & Gynecology

## 2019-02-06 DIAGNOSIS — Z3682 Encounter for antenatal screening for nuchal translucency: Secondary | ICD-10-CM

## 2019-02-07 ENCOUNTER — Encounter: Payer: Medicaid Other | Admitting: Advanced Practice Midwife

## 2019-02-07 ENCOUNTER — Other Ambulatory Visit: Payer: Medicaid Other

## 2019-02-07 ENCOUNTER — Ambulatory Visit: Payer: Medicaid Other | Admitting: *Deleted

## 2019-02-21 ENCOUNTER — Encounter: Payer: Medicaid Other | Admitting: Advanced Practice Midwife

## 2019-02-21 ENCOUNTER — Ambulatory Visit: Payer: Medicaid Other | Admitting: *Deleted

## 2022-02-14 ENCOUNTER — Encounter: Payer: Self-pay | Admitting: Family Medicine

## 2022-02-14 ENCOUNTER — Ambulatory Visit (INDEPENDENT_AMBULATORY_CARE_PROVIDER_SITE_OTHER): Payer: Medicaid Other | Admitting: Family Medicine

## 2022-02-14 ENCOUNTER — Other Ambulatory Visit: Payer: Self-pay | Admitting: Family Medicine

## 2022-02-14 VITALS — BP 107/77 | HR 68 | Ht 61.0 in | Wt 237.0 lb

## 2022-02-14 DIAGNOSIS — Z3200 Encounter for pregnancy test, result unknown: Secondary | ICD-10-CM

## 2022-02-14 DIAGNOSIS — F329 Major depressive disorder, single episode, unspecified: Secondary | ICD-10-CM | POA: Insufficient documentation

## 2022-02-14 DIAGNOSIS — Z114 Encounter for screening for human immunodeficiency virus [HIV]: Secondary | ICD-10-CM

## 2022-02-14 DIAGNOSIS — R079 Chest pain, unspecified: Secondary | ICD-10-CM | POA: Diagnosis not present

## 2022-02-14 DIAGNOSIS — E785 Hyperlipidemia, unspecified: Secondary | ICD-10-CM | POA: Diagnosis not present

## 2022-02-14 DIAGNOSIS — F320 Major depressive disorder, single episode, mild: Secondary | ICD-10-CM

## 2022-02-14 DIAGNOSIS — R7301 Impaired fasting glucose: Secondary | ICD-10-CM | POA: Diagnosis not present

## 2022-02-14 DIAGNOSIS — E039 Hypothyroidism, unspecified: Secondary | ICD-10-CM | POA: Diagnosis not present

## 2022-02-14 DIAGNOSIS — G43109 Migraine with aura, not intractable, without status migrainosus: Secondary | ICD-10-CM

## 2022-02-14 DIAGNOSIS — G43909 Migraine, unspecified, not intractable, without status migrainosus: Secondary | ICD-10-CM | POA: Insufficient documentation

## 2022-02-14 DIAGNOSIS — F32A Depression, unspecified: Secondary | ICD-10-CM

## 2022-02-14 DIAGNOSIS — Z1159 Encounter for screening for other viral diseases: Secondary | ICD-10-CM

## 2022-02-14 LAB — POCT URINE PREGNANCY: Preg Test, Ur: NEGATIVE

## 2022-02-14 MED ORDER — PROPRANOLOL HCL 20 MG PO TABS: 20.0000 mg | ORAL_TABLET | Freq: Two times a day (BID) | ORAL | 1 refills | Status: DC

## 2022-02-14 MED ORDER — PROPRANOLOL HCL 20 MG PO TABS: 20.0000 mg | ORAL_TABLET | Freq: Once | ORAL | 1 refills | Status: DC | PRN

## 2022-02-14 MED ORDER — NITROGLYCERIN 0.4 MG SL SUBL: 0.4000 mg | SUBLINGUAL_TABLET | SUBLINGUAL | 1 refills | Status: DC | PRN

## 2022-02-14 MED ORDER — SUMATRIPTAN SUCCINATE 25 MG PO TABS: 25.0000 mg | ORAL_TABLET | Freq: Once | ORAL | 1 refills | Status: DC | PRN

## 2022-02-14 NOTE — Patient Instructions (Addendum)
It was pleasure meeting with you today. Please take medications as prescribed. Follow up with your primary health provider if any health concerns arises. If symptoms worsen please contact your primary care provider.

## 2022-02-14 NOTE — Assessment & Plan Note (Signed)
Patient tried triptans medications before with no relief. Started patient on propranolol 20 mg as initial therapy Awaiting urine pregnancy results D/C if positive

## 2022-02-14 NOTE — Assessment & Plan Note (Addendum)
[place] Office Visit from 02/14/2022 in [facility]  PHQ-9 Total Score 17     Initial encounter Discussed non pharmacological interventions such seeing a therapist, support system, diet, exercise, sleep verbalizes understanding regarding plan of care and all questions answered. Follow up in 2-4 weeks possible medication management  Referral to behavioral health placed.

## 2022-02-14 NOTE — Progress Notes (Signed)
New Patient Office Visit   Subjective   Patient ID: Sandra Frost, female    DOB: [birth year]  Age: 32 y.o. MRN: [id number]  CC:  Chief Complaint  Patient presents with   Establish Care   Migraine    Patient states she has been getting worsening migraines in the past month.    Arm Pain    Patient complains of heaviness in her chest with arm pain radiating down to R hand.     HPI Sandra Frost 32 year old female, presents to establish care. She  has a past medical history of Bradycardia (2012), Headache, History of mastitis (08/27/2014), Infection, Mastitis, right, acute (08/20/2014), Migraine, and Ovarian cyst.  Migraine  This is a chronic problem. The current episode started more than 1 year ago. The problem occurs monthly. The problem has been waxing and waning. The pain is located in the Frontal and temporal region. The pain does not radiate. The quality of the pain is described as pulsating and throbbing. The pain is at a severity of 10/10. The pain is moderate. Associated symptoms include a loss of balance, nausea and a visual change. Pertinent negatives include no abdominal pain, blurred vision, ear pain, eye pain, facial sweating, numbness, photophobia, seizures, vomiting or weakness. Associated symptoms comments: Nausea, dizziness . The symptoms are aggravated by caffeine withdrawal. She has tried Excedrin and triptans for the symptoms. The treatment provided no relief. Her past medical history is significant for migraine headaches, migraines in the family and obesity.  Chest Pain  This is a recurrent problem. The current episode started 1 to 4 weeks ago. The onset quality is gradual. The problem occurs intermittently. The problem has been unchanged. The pain is present in the substernal region. The pain is at a severity of 6/10. The pain is mild. The quality of the pain is described as heavy and pressure. The pain radiates to the right shoulder and right arm. Associated symptoms  include headaches and nausea. Pertinent negatives include no abdominal pain, leg pain, lower extremity edema, malaise/fatigue, near-syncope, numbness, palpitations, shortness of breath, syncope, vomiting or weakness. The pain is aggravated by movement, exertion and emotional upset. She has tried acetaminophen for the symptoms. The treatment provided mild relief. Risk factors include obesity and stress.  Pertinent negatives for past medical history include no seizures.  Her family medical history is significant for CAD and heart disease.      Outpatient Encounter Medications as of 02/14/2022  Medication Sig   fluticasone (FLONASE) 50 MCG/ACT nasal spray Place 2 sprays into both nostrils daily.   IBU 800 MG tablet Take by mouth daily.   loratadine (CLARITIN) 10 MG tablet Take 1 tablet (10 mg total) by mouth daily.   nitroGLYCERIN (NITROSTAT) 0.4 MG SL tablet Place 1 tablet (0.4 mg total) under the tongue every 5 (five) minutes as needed for chest pain.   Prenatal Vit-Fe Fumarate-FA (PRENATAL MULTIVITAMIN) TABS tablet Take 1 tablet by mouth daily at 12 noon.   propranolol (INDERAL) 20 MG tablet Take 1 tablet (20 mg total) by mouth once as needed for up to 1 dose.   [DISCONTINUED] propranolol (INDERAL) 20 MG tablet Take 1 tablet (20 mg total) by mouth 2 (two) times daily.   [DISCONTINUED] SUMAtriptan (IMITREX) 25 MG tablet Take 1 tablet (25 mg total) by mouth once as needed for up to 1 dose for migraine. May repeat in 2 hours if headache persists or recurs.   No facility-administered encounter medications on file as of 02/14/2022.  No past surgical history on file.  Review of Systems  Constitutional:  Negative for chills and malaise/fatigue.  HENT:  Negative for ear pain.   Eyes:  Negative for blurred vision, photophobia and pain.  Respiratory:  Negative for shortness of breath and wheezing.   Cardiovascular:  Positive for chest pain. Negative for palpitations, syncope and near-syncope.   Gastrointestinal:  Positive for nausea. Negative for abdominal pain and vomiting.  Genitourinary:  Negative for dysuria.  Musculoskeletal:  Positive for myalgias.  Skin:  Negative for rash.  Neurological:  Positive for headaches and loss of balance. Negative for seizures, weakness and numbness.  Psychiatric/Behavioral:  Positive for depression. The patient is nervous/anxious.       Objective    BP 107/77   Pulse 68   Ht 5' 1"$  (1.549 m)   Wt 237 lb (107.5 kg)   SpO2 98%   BMI 44.78 kg/m   Physical Exam Constitutional:      Appearance: She is obese.  Cardiovascular:     Rate and Rhythm: Normal rate and regular rhythm.     Pulses: Normal pulses. No decreased pulses.          Carotid pulses are 2+ on the right side and 2+ on the left side.    Heart sounds: Normal heart sounds, S1 normal and S2 normal. No murmur heard. Pulmonary:     Effort: Pulmonary effort is normal. No respiratory distress.     Breath sounds: Normal breath sounds.  Musculoskeletal:        General: Normal range of motion.     Right lower leg: No edema.     Left lower leg: No edema.  Skin:    General: Skin is warm and dry.     Capillary Refill: Capillary refill takes less than 2 seconds.  Neurological:     General: No focal deficit present.     Mental Status: She is alert and oriented to person, place, and time.     Motor: No weakness.     Coordination: Coordination normal.     Gait: Gait normal.  Psychiatric:        Behavior: Behavior normal.        Thought Content: Thought content normal.       Assessment & Plan:  Migraine with aura and without status migrainosus, not intractable Assessment & Plan: Patient tried triptans medications before with no relief. Started patient on propranolol 20 mg as initial therapy Awaiting urine pregnancy results D/C if positive     Screening for HIV (human immunodeficiency virus) -     HIV Antibody (routine testing w rflx)  Need for hepatitis C screening  test -     Hepatitis C antibody  Hypothyroidism, unspecified type -     TSH + free T4  IFG (impaired fasting glucose) -     Hemoglobin A1c  Hyperlipidemia, unspecified hyperlipidemia type -     Lipid panel -     CMP14+EGFR -     CBC with Differential/Platelet  Chest pain, unspecified type Assessment & Plan: EKG ordered placed, urine pregnancy test  CBC, D-dimer labs ordered, Lipid panel Prescribed nitroglycerin and OTC Asprin 81 mg for chest pain Explained to patient if symptoms worsen contact health care provider and visit ED  Orders: -     EKG 12-Lead -     D-dimer, quantitative  Depression, unspecified depression type -     Amb ref to [place]  Encounter for pregnancy test, result unknown -  POCT urine pregnancy  Current mild episode of major depressive disorder, unspecified whether recurrent [facility]) Assessment & Plan: [place] Office Visit from 02/14/2022 in [facility]  PHQ-9 Total Score 17     Initial encounter Discussed non pharmacological interventions such seeing a therapist, support system, diet, exercise, sleep verbalizes understanding regarding plan of care and all questions answered. Follow up in 2-4 weeks possible medication management  Referral to behavioral health placed.   Other orders -     Propranolol HCl; Take 1 tablet (20 mg total) by mouth once as needed for up to 1 dose.  Dispense: 21 tablet; Refill: 1 -     Nitroglycerin; Place 1 tablet (0.4 mg total) under the tongue every 5 (five) minutes as needed for chest pain.  Dispense: 50 tablet; Refill: 1    No follow-ups on file.   Renard Hamper Ria Comment, FNP

## 2022-02-14 NOTE — Assessment & Plan Note (Addendum)
EKG ordered placed, urine pregnancy test ordered  CBC, D-dimer labs ordered, Lipid panel Prescribed nitroglycerin and OTC Asprin 81 mg for chest pain Explained to patient if symptoms worsen contact health care provider and visit ED

## 2022-02-15 ENCOUNTER — Other Ambulatory Visit: Payer: Self-pay | Admitting: Family Medicine

## 2022-02-15 DIAGNOSIS — R9431 Abnormal electrocardiogram [ECG] [EKG]: Secondary | ICD-10-CM

## 2022-02-17 LAB — CMP14+EGFR
ALT: 18 IU/L (ref 0–32)
AST: 10 IU/L (ref 0–40)
Albumin/Globulin Ratio: 1.9 (ref 1.2–2.2)
Albumin: 4.3 g/dL (ref 4.0–5.0)
Alkaline Phosphatase: 66 IU/L (ref 44–121)
BUN/Creatinine Ratio: 20 (ref 9–23)
BUN: 12 mg/dL (ref 6–20)
Bilirubin Total: 0.3 mg/dL (ref 0.0–1.2)
CO2: 21 mmol/L (ref 20–29)
Calcium: 9.1 mg/dL (ref 8.7–10.2)
Chloride: 104 mmol/L (ref 96–106)
Creatinine, Ser: 0.61 mg/dL (ref 0.57–1.00)
Globulin, Total: 2.3 g/dL (ref 1.5–4.5)
Glucose: 92 mg/dL (ref 70–99)
Potassium: 4.4 mmol/L (ref 3.5–5.2)
Sodium: 140 mmol/L (ref 134–144)
Total Protein: 6.6 g/dL (ref 6.0–8.5)
eGFR: 124 mL/min/{1.73_m2} (ref >59)

## 2022-02-17 LAB — CBC WITH DIFFERENTIAL/PLATELET
Basophils Absolute: 0 10*3/uL (ref 0.0–0.2)
Basos: 0 %
EOS (ABSOLUTE): 0.1 10*3/uL (ref 0.0–0.4)
Eos: 2 %
Hematocrit: 38.5 % (ref 34.0–46.6)
Hemoglobin: 13.1 g/dL (ref 11.1–15.9)
Immature Grans (Abs): 0 10*3/uL (ref 0.0–0.1)
Immature Granulocytes: 0 %
Lymphocytes Absolute: 1.6 10*3/uL (ref 0.7–3.1)
Lymphs: 29 %
MCH: 28.7 pg (ref 26.6–33.0)
MCHC: 34 g/dL (ref 31.5–35.7)
MCV: 84 fL (ref 79–97)
Monocytes Absolute: 0.4 10*3/uL (ref 0.1–0.9)
Monocytes: 6 %
Neutrophils Absolute: 3.5 10*3/uL (ref 1.4–7.0)
Neutrophils: 63 %
Platelets: 288 10*3/uL (ref 150–450)
RBC: 4.57 x10E6/uL (ref 3.77–5.28)
RDW: 12 % (ref 11.7–15.4)
WBC: 5.6 10*3/uL (ref 3.4–10.8)

## 2022-02-17 LAB — TSH+FREE T4
Free T4: 1.29 ng/dL (ref 0.82–1.77)
TSH: 0.649 u[IU]/mL (ref 0.450–4.500)

## 2022-02-17 LAB — HIV ANTIBODY (ROUTINE TESTING W REFLEX): HIV Screen 4th Generation wRfx: NONREACTIVE

## 2022-02-17 LAB — HEPATITIS C ANTIBODY: Hep C Virus Ab: NONREACTIVE

## 2022-02-17 LAB — D-DIMER, QUANTITATIVE: D-DIMER: 0.26 mg/L FEU (ref 0.00–0.49)

## 2022-02-17 LAB — HEMOGLOBIN A1C
Est. average glucose Bld gHb Est-mCnc: 94 mg/dL
Hgb A1c MFr Bld: 4.9 % (ref 4.8–5.6)

## 2022-02-17 LAB — LIPID PANEL
Chol/HDL Ratio: 3 ratio (ref 0.0–4.4)
Cholesterol, Total: 153 mg/dL (ref 100–199)
HDL: 51 mg/dL (ref >39)
LDL Chol Calc (NIH): 86 mg/dL (ref 0–99)
Triglycerides: 83 mg/dL (ref 0–149)
VLDL Cholesterol Cal: 16 mg/dL (ref 5–40)

## 2022-02-23 ENCOUNTER — Ambulatory Visit (INDEPENDENT_AMBULATORY_CARE_PROVIDER_SITE_OTHER): Payer: Medicaid Other | Admitting: Licensed Clinical Social Worker

## 2022-02-23 DIAGNOSIS — F4322 Adjustment disorder with anxiety: Secondary | ICD-10-CM | POA: Diagnosis not present

## 2022-02-28 ENCOUNTER — Encounter: Payer: Self-pay | Admitting: Family Medicine

## 2022-02-28 ENCOUNTER — Ambulatory Visit: Payer: Medicaid Other | Admitting: Family Medicine

## 2022-02-28 DIAGNOSIS — Z713 Dietary counseling and surveillance: Secondary | ICD-10-CM

## 2022-02-28 NOTE — Patient Instructions (Signed)
It was pleasure meeting with you today. Follow up with your primary health provider if any health concerns arises.

## 2022-02-28 NOTE — Progress Notes (Signed)
Patient Office Visit   Subjective   Patient ID: Walesca Plumlee, female    DOB: 07-Jan-1990  Age: 32 y.o. MRN: [id number]  CC:  Chief Complaint  Patient presents with   Weight Loss    Patient is here to discuss weight loss options.     HPI Shiya Kesselring 32 year old female, presents clinic for weight loss managment. She  has a past medical history of Bradycardia (2012), Headache, History of mastitis (08/27/2014), Infection, Mastitis, right, acute (08/20/2014), Migraine, and Ovarian cyst.  Patient cites spend more time with her children to do the things they love as reasons for wanting to lose weight.  Obesity History: Weight in late teens: 160 lb. Period of greatest weight gain: 238 lb during early adult years Lowest adult weight: 170 lb Highest adult weight: 238 lb  History of Weight Loss Efforts: Greatest amount of weight lost: 40 lb over 12 months Amount of time that loss was maintained: 12 months Circumstances associated with regain of weight: pregnancy  Successful weight loss techniques attempted: self-directed dieting and very low calorie diet  Current Exercise Habits cardiovascular workout on exercise equipment and weightlifting  Current Eating Habits Number of regular meals per day: 6 Number of snacking episodes per day: 3 Who shops for food? patient Who prepares food? patient Who eats with patient? patient, son, and daughter Binge behavior?: yes  Purge behavior? no Anorexic behavior? no Eating precipitated by stress? yes  Guilt feelings associated with eating? yes   Other Potential Contributing Factors Use of alcohol: average 0 drinks/week Use of medications that may cause weight gain none History of past abuse? none Psych History: obesity        Outpatient Encounter Medications as of 02/28/2022  Medication Sig   fluticasone (FLONASE) 50 MCG/ACT nasal spray Place 2 sprays into both nostrils daily.   IBU 800 MG tablet Take by mouth daily.   loratadine  (CLARITIN) 10 MG tablet Take 1 tablet (10 mg total) by mouth daily.   nitroGLYCERIN (NITROSTAT) 0.4 MG SL tablet Place 1 tablet (0.4 mg total) under the tongue every 5 (five) minutes as needed for chest pain.   Prenatal Vit-Fe Fumarate-FA (PRENATAL MULTIVITAMIN) TABS tablet Take 1 tablet by mouth daily at 12 noon.   propranolol (INDERAL) 20 MG tablet Take 1 tablet (20 mg total) by mouth once as needed for up to 1 dose.   No facility-administered encounter medications on file as of 02/28/2022.    No past surgical history on file.  Review of Systems  Constitutional:  Negative for chills and fever.  Gastrointestinal:  Negative for abdominal pain and vomiting.  Genitourinary:  Negative for dysuria and urgency.  Neurological:  Negative for dizziness and headaches.  Psychiatric/Behavioral:  The patient is not nervous/anxious.       Objective    BP 114/79   Pulse 74   Ht '5\' 1"'$  (1.549 m)   Wt 238 lb (108 kg)   SpO2 97%   BMI 44.97 kg/m   Physical Exam Vitals reviewed.  Cardiovascular:     Rate and Rhythm: Normal rate.     Pulses: Normal pulses.  Pulmonary:     Effort: Pulmonary effort is normal.     Breath sounds: Normal breath sounds.  Abdominal:     General: Bowel sounds are normal. There is no distension.     Palpations: Abdomen is soft. There is no mass.  Musculoskeletal:        General: Normal range of motion.  Skin:  General: Skin is warm and dry.     Capillary Refill: Capillary refill takes less than 2 seconds.  Neurological:     General: No focal deficit present.     Mental Status: She is alert.  Psychiatric:        Thought Content: Thought content normal.       Assessment & Plan:  Morbid obesity due to excess calories [facility]) Assessment & Plan: Started patient on weight management plan Discussed the importance to start eating 3 meals a day including breakfast, drink 8 glasses of water a day ,reduce portion sizes. reduced carbohydrates limit saturated and trans  fat, increase servings of vegetables and limit processed foods. Find an activity that you will enjoy and start to be active at least 5 days a week for 30 minutes each day. Keep a food journal or an activity journal to identify triggers that lead to emotional eating Follow up in 4 weeks for Weight loss management       Return in about 4 weeks (around 03/28/2022) for Weight Loss Mangment.   Renard Hamper Ria Comment, FNP

## 2022-02-28 NOTE — Assessment & Plan Note (Addendum)
Started patient on weight management plan Discussed the importance to start eating 3 meals a day including breakfast, drink 8 glasses of water a day ,reduce portion sizes. reduced carbohydrates limit saturated and trans fat, increase servings of vegetables and limit processed foods. Find an activity that you will enjoy and start to be active at least 5 days a week for 30 minutes each day. Keep a food journal or an activity journal to identify triggers that lead to emotional eating Follow up in 4 weeks for Weight loss management

## 2022-02-28 NOTE — BH Specialist Note (Signed)
Integrated Behavioral Health via Telemedicine Visit  02/28/2022 Sandra Frost [id number]  Number of [place] Clinician visits: 1 Session Start time: 9:59am  Session End time: 10:16am Total time in minutes: 17 mins via mychart video   Referring Provider: I. Helyn Numbers NP Patient/Family location: [facility] Provider location: [place]  All persons participating in visit: Pt Sandra Frost and LCSW A Bently Morath  Types of Service: Individual psychotherapy and Video visit  I connected with Sandra Frost and/or Sandra Frost via  Telephone or Video Enabled Telemedicine Application  (Video is Caregility application) and verified that I am speaking with the correct person using two identifiers. Discussed confidentiality: Yes   I discussed the limitations of telemedicine and the availability of in person appointments.  Discussed there is a possibility of technology failure and discussed alternative modes of communication if that failure occurs.  I discussed that engaging in this telemedicine visit, they consent to the provision of behavioral healthcare and the services will be billed under their insurance.  Patient and/or legal guardian expressed understanding and consented to Telemedicine visit: Yes   Presenting Concerns: Patient and/or family reports the following symptoms/concerns: adjustment disorder with anxious mood  Duration of problem: approx 6 months ; Severity of problem: mild  Patient and/or Family'Sandra Strengths/Protective Factors: Concrete supports in place (healthy food, safe environments, etc.)  Goals Addressed: Patient will:  Reduce symptoms of: stress, anxious mood and marital separation  Increase knowledge and/or ability of: coping skills and stress reduction   Demonstrate ability to: Increase healthy adjustment to current life circumstances  Progress towards Goals: Ongoing  Interventions: Interventions utilized:  Motivational Interviewing and  Supportive Counseling Standardized Assessments completed: Not Needed  Patient and/or Family Response: Sandra Frost reports anxious mood and stress since marital separation. Sandra Frost is intermittent moments of feeling overwhelmed with working, household and parenting task.   Assessment: Patient currently experiencing adjustment disorder with anxious mood.   Patient may benefit from integrated behavioral health.  Plan: Follow up with behavioral health clinician on : 3 weeks via mychart  Behavioral recommendations: Prioritize and delegate task, create co parenting plan with spouse, engage in self care  Referral(Sandra): [place] (In Clinic)  I discussed the assessment and treatment plan with the patient and/or parent/guardian. They were provided an opportunity to ask questions and all were answered. They agreed with the plan and demonstrated an understanding of the instructions.   They were advised to call back or seek an in-person evaluation if the symptoms worsen or if the condition fails to improve as anticipated.  Lynnea Ferrier, LCSW

## 2022-03-16 NOTE — Telephone Encounter (Signed)
erro  neous encounter

## 2022-03-28 ENCOUNTER — Ambulatory Visit: Payer: Medicaid Other | Admitting: Family Medicine

## 2022-03-28 ENCOUNTER — Encounter: Payer: Self-pay | Admitting: Family Medicine

## 2022-03-30 ENCOUNTER — Ambulatory Visit (INDEPENDENT_AMBULATORY_CARE_PROVIDER_SITE_OTHER): Payer: Medicaid Other | Admitting: Family Medicine

## 2022-03-30 ENCOUNTER — Encounter: Payer: Self-pay | Admitting: Family Medicine

## 2022-03-30 ENCOUNTER — Telehealth: Payer: Self-pay | Admitting: Family Medicine

## 2022-03-30 VITALS — BP 124/81 | HR 76

## 2022-03-30 DIAGNOSIS — F330 Major depressive disorder, recurrent, mild: Secondary | ICD-10-CM

## 2022-03-30 DIAGNOSIS — F339 Major depressive disorder, recurrent, unspecified: Secondary | ICD-10-CM

## 2022-03-30 MED ORDER — ESCITALOPRAM OXALATE 5 MG PO TABS: 5.0000 mg | ORAL_TABLET | Freq: Every day | ORAL | 1 refills | Status: DC

## 2022-03-30 NOTE — Telephone Encounter (Signed)
Patient and/or legal guardian verbally consented to [facility] services about presenting concerns and psychiatric consultation as appropriate.  The services will be billed as appropriate for the patient.

## 2022-03-30 NOTE — Assessment & Plan Note (Signed)
Patient loss 4 lbs since our last visit Zepound is covered by patient insurance Advise to continue lifestyle modifications follow diet low in saturated fat, reduce dietary salt intake, avoid fatty foods, maintain an exercise routine 3 to 5 days a week for a minimum total of 150 minutes.  Follow up in 4 weeks.

## 2022-03-30 NOTE — Progress Notes (Addendum)
Patient Office Visit   Subjective   Patient ID: Sandra Frost, female    DOB: Jan 20, 1990  Age: 32 y.o. MRN: [id number]  CC:  Chief Complaint  Patient presents with   Weight Loss    Patient is here for weight loss f/u.     HPI Sandra Frost 32 year old female presents to the clinic for weight loss management . She  has a past medical history of Bradycardia (2012), Headache, History of mastitis (08/27/2014), Infection, Mastitis, right, acute (08/20/2014), Migraine, and Ovarian cyst.  HPI  Patient is here for weight loss follow up. Healthy eating habits include for breakfast patient reported eating two boiled egg, one sausage link, and half a cup oatmeal. Lunch consist of greek yogurt with fresh fruits, and two slices of [place]. Dinner includes consuming pork, raw broccoli, and beans. For physical activities patient is incorporating run one mile at treadmill 7 days a week.Behavior mindset include limiting trigger foods and stress eating.  Outpatient Encounter Medications as of 03/30/2022  Medication Sig   escitalopram (LEXAPRO) 5 MG tablet Take 1 tablet (5 mg total) by mouth daily.   fluticasone (FLONASE) 50 MCG/ACT nasal spray Place 2 sprays into both nostrils daily.   loratadine (CLARITIN) 10 MG tablet Take 1 tablet (10 mg total) by mouth daily.   nitroGLYCERIN (NITROSTAT) 0.4 MG SL tablet Place 1 tablet (0.4 mg total) under the tongue every 5 (five) minutes as needed for chest pain.   Prenatal Vit-Fe Fumarate-FA (PRENATAL MULTIVITAMIN) TABS tablet Take 1 tablet by mouth daily at 12 noon.   propranolol (INDERAL) 20 MG tablet Take 1 tablet (20 mg total) by mouth once as needed for up to 1 dose.   [DISCONTINUED] IBU 800 MG tablet Take by mouth daily.   No facility-administered encounter medications on file as of 03/30/2022.    No past surgical history on file.  Review of Systems  Constitutional:  Negative for chills and fever.  Eyes:  Negative for blurred vision.  Respiratory:   Negative for shortness of breath.   Cardiovascular:  Negative for chest pain.  Gastrointestinal:  Negative for abdominal pain.  Genitourinary:  Negative for dysuria.  Musculoskeletal:  Negative for myalgias.  Neurological:  Negative for dizziness.  Psychiatric/Behavioral:  Positive for depression.       Objective    BP 124/81   Pulse 76   Physical Exam Constitutional:      Appearance: She is obese.  Cardiovascular:     Rate and Rhythm: Normal rate and regular rhythm.     Pulses: Normal pulses.     Heart sounds: Normal heart sounds.  Pulmonary:     Effort: Pulmonary effort is normal. No respiratory distress.     Breath sounds: Normal breath sounds.  Abdominal:     General: Bowel sounds are normal.     Palpations: Abdomen is soft. There is no mass.     Tenderness: There is no abdominal tenderness. There is no guarding.  Musculoskeletal:        General: Normal range of motion.     Right lower leg: No edema.     Left lower leg: No edema.  Skin:    General: Skin is warm and dry.     Capillary Refill: Capillary refill takes less than 2 seconds.  Neurological:     General: No focal deficit present.     Mental Status: She is alert.     Gait: Gait normal.     Deep Tendon Reflexes:  Reflexes normal.  Psychiatric:        Mood and Affect: Mood normal.       Assessment & Plan:  Depression, recurrent (HCC) -     Amb ref to [place] -     Escitalopram Oxalate; Take 1 tablet (5 mg total) by mouth daily.  Dispense: 30 tablet; Refill: 1  Mild episode of recurrent major depressive disorder [facility] Assessment & Plan: [place] Office Visit from 03/30/2022 in [facility]  PHQ-9 Total Score 20     Started patient on lexapro 5 mg as initial therapy  Discussed about cognitive behavioral therapy focusing on thoughts, belief, and attitudes that affects feelings and behavior, learning about coping skills to deal with certain  problems. Maintaining a consistent routine and schedule, Practice stress management and self calming techniques, excersise regularly and spend time outdoors, Do not eat food that are high in fat, added sugar, or salt. Follow up in 4 weeks Behavioral referral placed Patient  verbally consented to [facility] services about presenting concerns and psychiatric consultation as appropriate. The services will be billed as appropriate for the patient.    Morbid obesity due to excess calories [facility]) Assessment & Plan: Patient loss 4 lbs since our last visit Zepound is covered by patient insurance Advise to continue lifestyle modifications follow diet low in saturated fat, reduce dietary salt intake, avoid fatty foods, maintain an exercise routine 3 to 5 days a week for a minimum total of 150 minutes.  Follow up in 4 weeks.     Return in about 4 weeks (around 04/27/2022) for Weight Loss Mangment, depression .   Renard Hamper Ria Comment, FNP

## 2022-03-30 NOTE — Telephone Encounter (Signed)
Faythe Ghee got it thanks!

## 2022-03-30 NOTE — Patient Instructions (Signed)
It was pleasure meeting with you today. Follow up with your primary health provider if any health concerns arises.  

## 2022-03-30 NOTE — Assessment & Plan Note (Addendum)
[place] Office Visit from 03/30/2022 in [facility] Primary Care  PHQ-9 Total Score 20     Started patient on lexapro 5 mg as initial therapy  Discussed about cognitive behavioral therapy focusing on thoughts, belief, and attitudes that affects feelings and behavior, learning about coping skills to deal with certain problems. Maintaining a consistent routine and schedule, Practice stress management and self calming techniques, excersise regularly and spend time outdoors, Do not eat food that are high in fat, added sugar, or salt. Follow up in 4 weeks Behavioral referral placed Patient  verbally consented to [facility] services about presenting concerns and psychiatric consultation as appropriate. The services will be billed as appropriate for the patient.

## 2022-04-01 ENCOUNTER — Encounter: Payer: Self-pay | Admitting: Professional Counselor

## 2022-04-01 NOTE — BH Specialist Note (Signed)
A user error has taken place: encounter opened in error, closed for administrative reasons.
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
## 2022-04-04 ENCOUNTER — Encounter: Payer: Self-pay | Admitting: Family Medicine

## 2022-04-04 ENCOUNTER — Other Ambulatory Visit (HOSPITAL_COMMUNITY)
Admission: RE | Admit: 2022-04-04 | Discharge: 2022-04-04 | Disposition: A | Discharge status: Home / Self Care | Payer: Medicaid Other | Source: Ambulatory Visit | Attending: Family Medicine | Admitting: Family Medicine

## 2022-04-04 ENCOUNTER — Telehealth: Payer: Medicaid Other | Admitting: Family Medicine

## 2022-04-04 VITALS — Ht 62.0 in | Wt 234.0 lb

## 2022-04-04 DIAGNOSIS — R300 Dysuria: Secondary | ICD-10-CM | POA: Diagnosis not present

## 2022-04-04 NOTE — Progress Notes (Signed)
Virtual Visit via Video Note  I connected with Sandra Frost on 04/04/22 at  8:20 AM EDT by a video enabled telemedicine application and verified that I am speaking with the correct person using two identifiers.  Patient Location: Other:  Work Engineer, structural: Office/Clinic  I discussed the limitations, risks, security, and privacy concerns of performing an evaluation and management service by video and the availability of in person appointments. I also discussed with the patient that there may be a patient responsible charge related to this service. The patient expressed understanding and agreed to proceed.  Subjective: PCP: Juanda Chance, FNP  Chief Complaint  Patient presents with   Dehydration    Patient states she thinks she is dehydrated for 4 days. Complains of brown, strong smelling urine, and fatigue when she eats.    Urinary Tract Infection  The current episode started in the past 4 days ago. Patient reports dysuria and urgency every urination and has been gradually worsening. The quality of the pain is described as burning. The pain is at a severity of 7/10. There has been no fever. Patient reports brown colored urine with ammonia odor. There is no history of pyelonephritis. Associated symptoms include right flank pain, frequency and urgency. Pertinent negatives include no hematuria or nausea, vomiting. She has tried increased fluids OTC AZO for the symptoms. The treatment provided no relief. Her past medical history is significant for recurrent UTIs. There is no history of kidney stones.      ROS: Per HPI  Current Outpatient Medications:    escitalopram (LEXAPRO) 5 MG tablet, Take 1 tablet (5 mg total) by mouth daily., Disp: 30 tablet, Rfl: 1   fluticasone (FLONASE) 50 MCG/ACT nasal spray, Place 2 sprays into both nostrils daily., Disp: 16 g, Rfl: 0   loratadine (CLARITIN) 10 MG tablet, Take 1 tablet (10 mg total) by mouth daily., Disp: 30 tablet, Rfl: 0    nitroGLYCERIN (NITROSTAT) 0.4 MG SL tablet, Place 1 tablet (0.4 mg total) under the tongue every 5 (five) minutes as needed for chest pain., Disp: 50 tablet, Rfl: 1   Prenatal Vit-Fe Fumarate-FA (PRENATAL MULTIVITAMIN) TABS tablet, Take 1 tablet by mouth daily at 12 noon., Disp: , Rfl:    propranolol (INDERAL) 20 MG tablet, Take 1 tablet (20 mg total) by mouth once as needed for up to 1 dose., Disp: 21 tablet, Rfl: 1  Observations/Objective: Today's Vitals   04/04/22 0831  Weight: 234 lb (106.1 kg)  Height: 5\' 2"  (1.575 m)   Physical Exam Telehealth visit. Patient is alert and no acute distress noted.  Assessment and Plan: Dysuria Assessment & Plan: Urinalysis and urine culture ordered. Awaiting results will follow up.  May take OTC AZO for urinary pain relief. Explained to increase oral fluid intake drink 8 glasses of water daily. Follow up for worsening or persistent symptoms. Patient verbalizes understanding regarding plan of care and all questions answered.   Orders: -     Urinalysis -     Urine Culture -     NuSwab Vaginitis Plus (VG+) -     Urine cytology ancillary only    Follow Up Instructions: No follow-ups on file.   I discussed the assessment and treatment plan with the patient. The patient was provided an opportunity to ask questions, and all were answered. The patient agreed with the plan and demonstrated an understanding of the instructions.   The patient was advised to call back or seek an in-person evaluation if the  symptoms worsen or if the condition fails to improve as anticipated.  The above assessment and management plan was discussed with the patient. The patient verbalized understanding of and has agreed to the management plan.   Renard Hamper Ria Comment, FNP

## 2022-04-04 NOTE — Assessment & Plan Note (Addendum)
Urinalysis and urine culture ordered. Awaiting results will follow up.  May take OTC AZO for urinary pain relief. Explained to increase oral fluid intake drink 8 glasses of water daily. Follow up for worsening or persistent symptoms. Patient verbalizes understanding regarding plan of care and all questions answered.

## 2022-04-05 ENCOUNTER — Other Ambulatory Visit: Payer: Self-pay | Admitting: Family Medicine

## 2022-04-05 DIAGNOSIS — N390 Urinary tract infection, site not specified: Secondary | ICD-10-CM

## 2022-04-05 LAB — URINE CYTOLOGY ANCILLARY ONLY
Bacterial Vaginitis-Urine: NEGATIVE
Candida Urine: NEGATIVE
Chlamydia: NEGATIVE
Comment: NEGATIVE
Comment: NEGATIVE
Comment: NORMAL
Neisseria Gonorrhea: NEGATIVE
Trichomonas: NEGATIVE

## 2022-04-05 LAB — URINALYSIS
Bilirubin, UA: NEGATIVE
Glucose, UA: NEGATIVE
Ketones, UA: NEGATIVE
Nitrite, UA: NEGATIVE
Protein,UA: NEGATIVE
RBC, UA: NEGATIVE
Specific Gravity, UA: 1.026 (ref 1.005–1.030)
Urobilinogen, Ur: 0.2 mg/dL (ref 0.2–1.0)
pH, UA: 5.5 (ref 5.0–7.5)

## 2022-04-05 MED ORDER — SULFAMETHOXAZOLE-TRIMETHOPRIM 800-160 MG PO TABS
1.0000 | ORAL_TABLET | Freq: Two times a day (BID) | ORAL | 0 refills | Status: AC
Start: 2022-04-05 — End: 2022-04-10

## 2022-04-06 LAB — URINE CULTURE

## 2022-04-07 ENCOUNTER — Encounter: Payer: Self-pay | Admitting: Professional Counselor

## 2022-04-07 ENCOUNTER — Ambulatory Visit (INDEPENDENT_AMBULATORY_CARE_PROVIDER_SITE_OTHER): Payer: Medicaid Other | Admitting: Professional Counselor

## 2022-04-07 DIAGNOSIS — F339 Major depressive disorder, recurrent, unspecified: Secondary | ICD-10-CM

## 2022-04-13 ENCOUNTER — Encounter: Payer: Self-pay | Admitting: Internal Medicine

## 2022-04-13 ENCOUNTER — Ambulatory Visit: Payer: Medicaid Other | Attending: Internal Medicine | Admitting: Internal Medicine

## 2022-04-13 VITALS — BP 114/70 | HR 102 | Ht 62.0 in | Wt 241.0 lb

## 2022-04-13 DIAGNOSIS — R072 Precordial pain: Secondary | ICD-10-CM

## 2022-04-13 NOTE — Patient Instructions (Signed)
Medication Instructions:  Your physician recommends that you continue on your current medications as directed. Please refer to the Current Medication list given to you today.  *If you need a refill on your cardiac medications before your next appointment, please call your pharmacy*   Lab Work: None If you have labs (blood work) drawn today and your tests are completely normal, you will receive your results only by: MyChart Message (if you have MyChart) OR A paper copy in the mail If you have any lab test that is abnormal or we need to change your treatment, we will call you to review the results.   Testing/Procedures: None   Follow-Up: At  [facility], you and your health needs are our priority.  As part of our continuing mission to provide you with exceptional heart care, we have created designated Provider Care Teams.  These Care Teams include your primary Cardiologist (physician) and Advanced Practice Providers (APPs -  Physician Assistants and Nurse Practitioners) who all work together to provide you with the care you need, when you need it.  We recommend signing up for the patient portal called "MyChart".  Sign up information is provided on this After Visit Summary.  MyChart is used to connect with patients for Virtual Visits (Telemedicine).  Patients are able to view lab/test results, encounter notes, upcoming appointments, etc.  Non-urgent messages can be sent to your provider as well.   To learn more about what you can do with MyChart, go to [email or web address].    Your next appointment:   Follow up as needed.    Provider:   Vishnu Mallipeddi, MD    Other Instructions    

## 2022-04-13 NOTE — Progress Notes (Signed)
Cardiology Office Note  Date: 04/13/2022   ID: Sandra Frost, DOB January 01, 1991, MRN [id number]  PCP:  Rica Records, FNP  Cardiologist:  Marjo Bicker, MD Electrophysiologist:  None   Reason for Office Visit: Evaluation of chest pain and abnormal EKG at the request of FNP. Del,    History of Present Illness: Sandra Frost is a 32 y.o. female with no PMH was referred to cardiology clinic for evaluation of abnormal EKG and chest pain.  Patient has 6 kids to take care of and also works a full-time job.  She has chest pain whenever she gets stressed out or has anxiety. Otherwise, she goes to gym, walks on treadmill and lifts weights with no symptoms of angina or DOE. Denies other symptoms of dizziness, lightheadedness, syncope, leg swelling. Relatives on her mother side has history of PCI and congestive heart failure at a young age but her immediate family has no history of premature ASCVD. Denies smoking cigarettes, alcohol use and illicit drug abuse.  Past Medical History:  Diagnosis Date   Bradycardia 2012   one episode, 3-day hospitalization, no Rx   Headache    History of mastitis 08/27/2014   Infection    UTI   Mastitis, right, acute 08/20/2014   Migraine    Ovarian cyst     History reviewed. No pertinent surgical history.  Current Outpatient Medications  Medication Sig Dispense Refill   escitalopram (LEXAPRO) 5 MG tablet Take 1 tablet (5 mg total) by mouth daily. 30 tablet 1   fluticasone (FLONASE) 50 MCG/ACT nasal spray Place 2 sprays into both nostrils daily. 16 g 0   loratadine (CLARITIN) 10 MG tablet Take 1 tablet (10 mg total) by mouth daily. 30 tablet 0   MIRENA, 52 MG, 20 MCG/DAY IUD 1 each by Intrauterine route once.     nitroGLYCERIN (NITROSTAT) 0.4 MG SL tablet Place 1 tablet (0.4 mg total) under the tongue every 5 (five) minutes as needed for chest pain. 50 tablet 1   Prenatal Vit-Fe Fumarate-FA (PRENATAL MULTIVITAMIN) TABS tablet Take 1 tablet by  mouth daily at 12 noon. (Patient not taking: Reported on 04/13/2022)     propranolol (INDERAL) 20 MG tablet Take 1 tablet (20 mg total) by mouth once as needed for up to 1 dose. (Patient not taking: Reported on 04/13/2022) 21 tablet 1   No current facility-administered medications for this visit.   Allergies:  Penicillins   Social History: The patient  reports that she has never smoked. She has never used smokeless tobacco. She reports that she does not drink alcohol and does not use drugs.   Family History: The patient's family history includes Asthma in her daughter; Cancer in her maternal grandmother and paternal grandmother; Diabetes in her mother; Heart disease in her father and paternal grandmother; Seizures in her father; Stroke in her maternal grandfather.   ROS:  Please see the history of present illness. Otherwise, complete review of systems is positive for none.  All other systems are reviewed and negative.   Physical Exam: VS:  BP 114/70   Pulse (!) 102   Ht 5\' 2"  (1.575 m)   Wt 241 lb (109.3 kg)   SpO2 98%   BMI 44.08 kg/m , BMI Body mass index is 44.08 kg/m.  Wt Readings from Last 3 Encounters:  04/13/22 241 lb (109.3 kg)  04/04/22 234 lb (106.1 kg)  02/28/22 238 lb (108 kg)    General: Patient appears comfortable at rest. HEENT: Conjunctiva and  lids normal, oropharynx clear with moist mucosa. Neck: Supple, no elevated JVP or carotid bruits, no thyromegaly. Lungs: Clear to auscultation, nonlabored breathing at rest. Cardiac: Regular rate and rhythm, no S3 or significant systolic murmur, no pericardial rub. Abdomen: Soft, nontender, no hepatomegaly, bowel sounds present, no guarding or rebound. Extremities: No pitting edema, distal pulses 2+. Skin: Warm and dry. Musculoskeletal: No kyphosis. Neuropsychiatric: Alert and oriented x3, affect grossly appropriate.  ECG: NSR, no ST changes  Recent Labwork: 02/15/2022: ALT 18; AST 10; BUN 12; Creatinine, Ser 0.61;  Hemoglobin 13.1; Platelets 288; Potassium 4.4; Sodium 140; TSH 0.649     Component Value Date/Time   CHOL 153 02/15/2022 0819   TRIG 83 02/15/2022 0819   HDL 51 02/15/2022 0819   CHOLHDL 3.0 02/15/2022 0819   LDLCALC 86 02/15/2022 0819    Other Studies Reviewed Today:   Assessment and Plan: Patient is a 32 year old F with no PMH was referred to cardiology clinic for evaluation of chest pain and abnormal EKG  # Abnormal EKG: I personally reviewed the EKG which showed NSR and no ST-T changes # Chest pain likely secondary to stress/anxiety: Chest pain occurs only during stress/anxiety. No symptoms of angina when she walks on treadmill and lifts weights. No indication of further cardiac testing at this time.  Encouraged patient to reduce stress in her life.  I have spent a total of 30 minutes with patient reviewing chart, EKGs, labs and examining patient as well as establishing an assessment and plan that was discussed with the patient.  > 50% of time was spent in direct patient care.     Medication Adjustments/Labs and Tests Ordered: Current medicines are reviewed at length with the patient today.  Concerns regarding medicines are outlined above.   Tests Ordered: No orders of the defined types were placed in this encounter.   Medication Changes: No orders of the defined types were placed in this encounter.   Disposition:  Follow up prn  Signed, Elon Lomeli Verne Spurr, MD, 04/13/2022 10:09 AM    [facility] [address] [place], [place] [address]

## 2022-04-18 ENCOUNTER — Ambulatory Visit (INDEPENDENT_AMBULATORY_CARE_PROVIDER_SITE_OTHER): Payer: Medicaid Other | Admitting: Professional Counselor

## 2022-04-18 DIAGNOSIS — F339 Major depressive disorder, recurrent, unspecified: Secondary | ICD-10-CM

## 2022-04-18 NOTE — BH Specialist Note (Signed)
Integrated Behavioral Health Treatment Planning Team  MRN: [id number] NAME: Sandra Frost  DATE: 04/18/22  Start time:  1:00 pm  End time: 1:30  Total time in minutes: 30  Total number of Virtual BH Treatment Team Plan encounters: 1/4  Treatment Team Attendees: Esmond Harps and Dr. Vanetta Shawl  Diagnoses:    ICD-10-CM   1. Depression, recurrent  F33.9       Goals, Interventions and Follow-up Plan Goals: Patient will: Increase healthy adjustment to current life circumstances and reduce symptoms of: stress and also Increase knowledge and/or ability of:  healthy coping skills Interventions: Motivational Interviewing CBT Cognitive Behavioral Therapy     Medication Management Recommendations: N/A Patient is not interested in pharmacological treatment at this time.   Follow up Plan: Continue with bi-weekly visits.  History of the present illness Presenting Problem/Current Symptoms:  Sandra Frost is a 32 yo female from [facility] being referred to counseling for recurrent depression. Sandra Frost presents to counseling with difficulty adjusting to pending divorce and seperation with her spouse. She has 6 children all of which reside with her 3/4 time. She reports conflict with husband and feels overwhelmed with the demanding pressure of being the primary caretaker of her children as well as working full time. She denies SI and MI. She denies any history of suicide attempts or bi-polar diagnosis. She admits maladaptive coping strategies and difficulty dealing with emotions effectively. She is seeking help finding more effective coping strategies and support as she maneuvers through this adjustment period. She is not interested in pharmacological treatment at this time. Patient would benefit from counseling for emotional regulation.   Screenings PHQ-9 Assessments:     04/07/2022    9:45 AM 03/30/2022    8:05 AM 02/28/2022    9:57 AM  Depression screen PHQ 2/9  Decreased Interest 3 3 1   Down,  Depressed, Hopeless 2 2 1   PHQ - 2 Score 5 5 2   Altered sleeping 3 3 3   Tired, decreased energy 3 3 2   Change in appetite 3 3 1   Feeling bad or failure about yourself  3 3 2   Trouble concentrating 3 3 3   Moving slowly or fidgety/restless 0 0 0  Suicidal thoughts 0 0 0  PHQ-9 Score 20 20 13   Difficult doing work/chores Very difficult Very difficult Somewhat difficult   GAD-7 Assessments:     04/07/2022    9:44 AM 03/30/2022    8:06 AM 02/28/2022    9:57 AM 02/14/2022   10:12 AM  GAD 7 : Generalized Anxiety Score  Nervous, Anxious, on Edge 2 2 1 1   Control/stop worrying 2 2 0 1  Worry too much - different things 2 2 2 2   Trouble relaxing 3 3 3 3   Restless 3 3 1 2   Easily annoyed or irritable 2 2 2 2   Afraid - awful might happen 0 0 0 0  Total GAD 7 Score 14 14 9 11   Anxiety Difficulty Somewhat difficult Somewhat difficult Somewhat difficult Somewhat difficult    Psychiatric History  Depression: Yes Anxiety: Yes Mania: No Psychosis: No PTSD symptoms: Yes Psychiatric History: has previously been in counseling  Past Psychiatric History/Hospitalization(s): Hospitalization for psychiatric illness: No Prior Suicide Attempts: No Prior Self-injurious behavior: No  Treatment Barriers: Lack of childcare, ambivalence Strengths/Protective Factors: Resilience, support network   Social History:  Social History   Socioeconomic History   Marital status: Legally Separated    Spouse name: Not on file   Number of children: Not on file  Years of education: Not on file   Highest education level: Not on file  Occupational History   Not on file  Tobacco Use   Smoking status: Never   Smokeless tobacco: Never  Vaping Use   Vaping Use: Never used  Substance and Sexual Activity   Alcohol use: No   Drug use: No   Sexual activity: Yes    Birth control/protection: None    Comment: [redacted] weeks pregnant  Other Topics Concern   Not on file  Social History Narrative   Not on file    Social Determinants of Health   Financial Resource Strain: Not on file  Food Insecurity: Not on file  Transportation Needs: Not on file  Physical Activity: Not on file  Stress: Not on file  Social Connections: Not on file   Past Medical History Medical History:  Past Medical History:  Diagnosis Date   Bradycardia 2012   one episode, 3-day hospitalization, no Rx   Headache    History of mastitis 08/27/2014   Infection    UTI   Mastitis, right, acute 08/20/2014   Migraine    Ovarian cyst    Allergies:  Allergies as of 04/18/2022 - Review Complete 04/13/2022  Allergen Reaction Noted   Penicillins Hives 12/02/2010   Labs:  Recent Results (from the past 2160 hour(s))  POCT urine pregnancy     Status: Normal   Collection Time: 02/14/22 12:59 PM  Result Value Ref Range   Preg Test, Ur Negative Negative  Hepatitis C antibody     Status: None   Collection Time: 02/15/22  8:19 AM  Result Value Ref Range   Hep C Virus Ab Non Reactive Non Reactive    Comment: HCV antibody alone does not differentiate between previously resolved infection and active infection. Equivocal and Reactive HCV antibody results should be followed up with an HCV RNA test to support the diagnosis of active HCV infection.   HIV Antibody (routine testing w rflx)     Status: None   Collection Time: 02/15/22  8:19 AM  Result Value Ref Range   HIV Screen 4th Generation wRfx Non Reactive Non Reactive    Comment: HIV Negative HIV-1/HIV-2 antibodies and HIV-1 p24 antigen were NOT detected. There is no laboratory evidence of HIV infection.   TSH + free T4     Status: None   Collection Time: 02/15/22  8:19 AM  Result Value Ref Range   TSH 0.649 0.450 - 4.500 uIU/mL   Free T4 1.29 0.82 - 1.77 ng/dL  Lipid panel     Status: None   Collection Time: 02/15/22  8:19 AM  Result Value Ref Range   Cholesterol, Total 153 100 - 199 mg/dL   Triglycerides 83 0 - 149 mg/dL   HDL 51 >16 mg/dL   VLDL Cholesterol Cal  16 5 - 40 mg/dL   LDL Chol Calc (NIH) 86 0 - 99 mg/dL   Chol/HDL Ratio 3.0 0.0 - 4.4 ratio    Comment:                                   T. Chol/HDL Ratio                                             Men  Women  1/2 Avg.Risk  3.4    3.3                                   Avg.Risk  5.0    4.4                                2X Avg.Risk  9.6    7.1                                3X Avg.Risk 23.4   11.0   Hemoglobin A1c     Status: None   Collection Time: 02/15/22  8:19 AM  Result Value Ref Range   Hgb A1c MFr Bld 4.9 4.8 - 5.6 %    Comment:          Prediabetes: 5.7 - 6.4          Diabetes: >6.4          Glycemic control for adults with diabetes: <7.0    Est. average glucose Bld gHb Est-mCnc 94 mg/dL  [id number]     Status: None   Collection Time: 02/15/22  8:19 AM  Result Value Ref Range   Glucose 92 70 - 99 mg/dL   BUN 12 6 - 20 mg/dL   Creatinine, Ser 7.82 0.57 - 1.00 mg/dL   eGFR 956 >21 HY/QMV/7.84   BUN/Creatinine Ratio 20 9 - 23   Sodium 140 134 - 144 mmol/L   Potassium 4.4 3.5 - 5.2 mmol/L   Chloride 104 96 - 106 mmol/L   CO2 21 20 - 29 mmol/L   Calcium 9.1 8.7 - 10.2 mg/dL   Total Protein 6.6 6.0 - 8.5 g/dL   Albumin 4.3 4.0 - 5.0 g/dL   Globulin, Total 2.3 1.5 - 4.5 g/dL   Albumin/Globulin Ratio 1.9 1.2 - 2.2   Bilirubin Total 0.3 0.0 - 1.2 mg/dL   Alkaline Phosphatase 66 44 - 121 IU/L   AST 10 0 - 40 IU/L   ALT 18 0 - 32 IU/L  CBC with Differential/Platelet     Status: None   Collection Time: 02/15/22  8:19 AM  Result Value Ref Range   WBC 5.6 3.4 - 10.8 x10E3/uL   RBC 4.57 3.77 - 5.28 x10E6/uL   Hemoglobin 13.1 11.1 - 15.9 g/dL   Hematocrit 69.6 29.5 - 46.6 %   MCV 84 79 - 97 fL   MCH 28.7 26.6 - 33.0 pg   MCHC 34.0 31.5 - 35.7 g/dL   RDW 28.4 13.2 - 44.0 %   Platelets 288 150 - 450 x10E3/uL   Neutrophils 63 Not Estab. %   Lymphs 29 Not Estab. %   Monocytes 6 Not Estab. %   Eos 2 Not Estab. %   Basos 0 Not Estab. %    Neutrophils Absolute 3.5 1.4 - 7.0 x10E3/uL   Lymphocytes Absolute 1.6 0.7 - 3.1 x10E3/uL   Monocytes Absolute 0.4 0.1 - 0.9 x10E3/uL   EOS (ABSOLUTE) 0.1 0.0 - 0.4 x10E3/uL   Basophils Absolute 0.0 0.0 - 0.2 x10E3/uL   Immature Granulocytes 0 Not Estab. %   Immature Grans (Abs) 0.0 0.0 - 0.1 x10E3/uL  D-dimer, quantitative     Status: None   Collection Time: 02/15/22  8:23 AM  Result Value Ref Range  D-DIMER 0.26 0.00 - 0.49 mg/L FEU    Comment: According to the assay manufacturer's published package insert, a normal (<0.50 mg/L FEU) D-dimer result in conjunction with a non-high clinical probability assessment, excludes deep vein thrombosis (DVT) and pulmonary embolism (PE) with high sensitivity. D-dimer values increase with age and this can make VTE exclusion of an older population difficult. To address this, the [employer], based on best available evidence and recent guidelines, recommends that clinicians use age-adjusted D-dimer thresholds in patients greater than 81 years of age with: a) a low probability of PE who do not meet all Pulmonary Embolism Rule Out Criteria, or b) in those with intermediate probability of PE. The formula for an age-adjusted D-dimer cut-off is "age/100". For example, a 30 year old patient would have an age-adjusted cut-off of 0.60 mg/L FEU and an 30 year old 0.80 mg/L FEU.   Urine cytology ancillary only     Status: None   Collection Time: 04/04/22  9:19 AM  Result Value Ref Range   Trichomonas Negative    Chlamydia Negative    Neisseria Gonorrhea Negative    Bacterial Vaginitis-Urine Negative    Candida Urine Negative    Molecular Comment      For tests bacteria and/or candida, this specimen does not meet the   Molecular Comment      strict criteria set by the FDA. The result interpretation should be   Molecular Comment      considered in conjunction with the patient's clinical history.   Comment Normal Reference Range  Trichomonas - Negative    Comment Normal Reference Ranger Chlamydia - Negative    Comment      Normal Reference Range Neisseria Gonorrhea - Negative  Urinalysis     Status: Abnormal   Collection Time: 04/04/22 10:55 AM  Result Value Ref Range   Specific Gravity, UA 1.026 1.005 - 1.030   pH, UA 5.5 5.0 - 7.5   Color, UA Yellow Yellow   Appearance Ur Clear Clear   Leukocytes,UA 2+ (A) Negative   Protein,UA Negative Negative/Trace   Glucose, UA Negative Negative   Ketones, UA Negative Negative   RBC, UA Negative Negative   Bilirubin, UA Negative Negative   Urobilinogen, Ur 0.2 0.2 - 1.0 mg/dL   Nitrite, UA Negative Negative  Urine Culture     Status: None   Collection Time: 04/04/22 10:55 AM   Specimen: Urine   UR  Result Value Ref Range   Urine Culture, Routine Final report    Organism ID, Bacteria Comment     Comment: Mixed urogenital flora 50,000-100,000 colony forming units per mL     Medication History: Current medications:  Outpatient Encounter Medications as of 04/18/2022  Medication Sig   escitalopram (LEXAPRO) 5 MG tablet Take 1 tablet (5 mg total) by mouth daily.   fluticasone (FLONASE) 50 MCG/ACT nasal spray Place 2 sprays into both nostrils daily.   loratadine (CLARITIN) 10 MG tablet Take 1 tablet (10 mg total) by mouth daily.   MIRENA, 52 MG, 20 MCG/DAY IUD 1 each by Intrauterine route once.   nitroGLYCERIN (NITROSTAT) 0.4 MG SL tablet Place 1 tablet (0.4 mg total) under the tongue every 5 (five) minutes as needed for chest pain.   Prenatal Vit-Fe Fumarate-FA (PRENATAL MULTIVITAMIN) TABS tablet Take 1 tablet by mouth daily at 12 noon. (Patient not taking: Reported on 04/13/2022)   propranolol (INDERAL) 20 MG tablet Take 1 tablet (20 mg total) by mouth once as needed  for up to 1 dose. (Patient not taking: Reported on 04/13/2022)   No facility-administered encounter medications on file as of 04/18/2022.      Scribe for Treatment Team: Reuel Boom

## 2022-04-21 ENCOUNTER — Ambulatory Visit (INDEPENDENT_AMBULATORY_CARE_PROVIDER_SITE_OTHER): Payer: Medicaid Other | Admitting: Professional Counselor

## 2022-04-21 DIAGNOSIS — F419 Anxiety disorder, unspecified: Secondary | ICD-10-CM

## 2022-04-21 DIAGNOSIS — F339 Major depressive disorder, recurrent, unspecified: Secondary | ICD-10-CM

## 2022-04-21 NOTE — BH Specialist Note (Signed)
Integrated Behavioral Health Follow Up In-Person Visit  MRN: [id number] Name: Sandra Frost  Number of Integrated Behavioral Health Clinician visits: 3- Third Visit  Session Start time: 1000   Session End time: 1030  Total time in minutes: 30   Types of Service: Collaborative care   Subjective: Patient is a 32 y/o female presented for collaborative care follow up for depression/anxiety. Patient presents to visit on time and neatly dressed. Patient explains "I am feeling a little better" as her depression symptoms have decreased. Patient reports her anxiety has increased due to current living situation. She reports only sleeping 4-5 hours a night, feeling stressed from pending divorce and lacks time for self care. She reports work and appetite are good.  She reports feeling controlled by husband and feels anxiety about that. She also has to move herself and 6 children from their current home. Patient denies SI/MI  Objective: Patient arrived to session on time and was neatly dressed. She presented with calm affect, making good eye contact and mood congruent to topics discussed.  .  Assessment:  Patient is open to continue with counseling but refuses the recommendation to consider medication. Counselor provided her with solution focused intervention tools to address sleep hygiene and self care. Patient has 6 kids and is the primary caretaker along with her work schedule she lacks the time needed for adequate self care. Her sleeping habits are impacting her mood and stress level. Patient needs more routine in her sleep and self care.   Plan: Patient was provided a mindfulness exercise to practice. She was also given education on sleep hygiene and a worksheet to help build a better routine with sleep. Lastly she was recommended to cut back on screen time and spend more time outside. Continue with collaborative care.   Goals Addressed: Patient will:   Reduce symptoms of: anxiety and stress    Increase knowledge and/or ability of: coping skills and healthy habits   Demonstrate ability to: Increase healthy adjustment to current life circumstances and    Progress towards Goals: Patient has made moderate progress on goals.   Interventions: Interventions utilized:  Solution-Focused Strategies Standardized Assessments completed: GAD-7 and PHQ 9    04/21/2022   10:25 AM 04/07/2022    9:45 AM 03/30/2022    8:05 AM  PHQ9 SCORE ONLY  PHQ-9 Total Score 04/21/2022   10:27 AM 04/07/2022    9:44 AM 03/30/2022    8:06 AM 02/28/2022    9:57 AM  GAD 7 : Generalized Anxiety Score  Nervous, Anxious, on Edge Control/stop worrying 0  Worry too much - different things Trouble relaxing Restless Easily annoyed or irritable Afraid - awful might happen 2 0 0 0  Total GAD 7 Score Anxiety Difficulty Very difficult Somewhat difficult Somewhat difficult Somewhat difficult     Reuel Boom

## 2022-04-21 NOTE — Patient Instructions (Addendum)
Follow Collaborative Care Plan

## 2022-04-27 ENCOUNTER — Encounter: Payer: Self-pay | Admitting: Family Medicine

## 2022-04-27 ENCOUNTER — Ambulatory Visit (INDEPENDENT_AMBULATORY_CARE_PROVIDER_SITE_OTHER): Payer: Medicaid Other | Admitting: Family Medicine

## 2022-04-27 DIAGNOSIS — Z713 Dietary counseling and surveillance: Secondary | ICD-10-CM

## 2022-04-27 MED ORDER — TIRZEPATIDE-WEIGHT MANAGEMENT 2.5 MG/0.5ML ~~LOC~~ SOAJ
2.5000 mg | SUBCUTANEOUS | 0 refills | Status: DC
Start: 2022-04-27 — End: 2022-05-04

## 2022-04-27 NOTE — Progress Notes (Addendum)
Patient Office Visit   Subjective   Patient ID: Sandra Frost, female    DOB: 07-04-1990  Age: 32 y.o. MRN: [id number]  CC:  Chief Complaint  Patient presents with   Depression    F/u    HPI Sandra Banda32 year old female, presents to the clinic for weight loss management.She  has a past medical history of Bradycardia (2012), Headache, History of mastitis (08/27/2014), Infection, Mastitis, right, acute (08/20/2014), Migraine, and Ovarian cyst.For the details of today's visit, please refer to assessment and plan.   HPI  Outpatient Encounter Medications as of 04/27/2022  Medication Sig   fluticasone (FLONASE) 50 MCG/ACT nasal spray Place 2 sprays into both nostrils daily.   loratadine (CLARITIN) 10 MG tablet Take 1 tablet (10 mg total) by mouth daily.   MIRENA, 52 MG, 20 MCG/DAY IUD 1 each by Intrauterine route once.   nitroGLYCERIN (NITROSTAT) 0.4 MG SL tablet Place 1 tablet (0.4 mg total) under the tongue every 5 (five) minutes as needed for chest pain.   Prenatal Vit-Fe Fumarate-FA (PRENATAL MULTIVITAMIN) TABS tablet Take 1 tablet by mouth daily at 12 noon.   propranolol (INDERAL) 20 MG tablet Take 1 tablet (20 mg total) by mouth once as needed for up to 1 dose.   Semaglutide-Weight Management 0.25 MG/0.5ML SOAJ Inject 0.25 mg into the skin once a week for 28 days.   [DISCONTINUED] escitalopram (LEXAPRO) 5 MG tablet Take 1 tablet (5 mg total) by mouth daily.   [DISCONTINUED] tirzepatide (ZEPBOUND) 2.5 MG/0.5ML Pen Inject 2.5 mg into the skin once a week.   No facility-administered encounter medications on file as of 04/27/2022.    History reviewed. No pertinent surgical history.  Review of Systems  Constitutional:  Negative for chills and fever.  Respiratory:  Negative for shortness of breath.   Cardiovascular:  Negative for chest pain.  Gastrointestinal:  Negative for abdominal pain and vomiting.  Genitourinary:  Negative for dysuria.  Musculoskeletal:  Negative for  myalgias.  Neurological:  Negative for dizziness and headaches.      Objective    There were no vitals taken for this visit.  Physical Exam Vitals reviewed.  Constitutional:      General: She is not in acute distress.    Appearance: Normal appearance. She is obese. She is not ill-appearing, toxic-appearing or diaphoretic.  HENT:     Head: Normocephalic.  Eyes:     General:        Right eye: No discharge.        Left eye: No discharge.     Conjunctiva/sclera: Conjunctivae normal.  Cardiovascular:     Rate and Rhythm: Normal rate.     Pulses: Normal pulses.     Heart sounds: Normal heart sounds.  Pulmonary:     Effort: Pulmonary effort is normal. No respiratory distress.     Breath sounds: Normal breath sounds.  Musculoskeletal:        General: Normal range of motion.     Cervical back: Normal range of motion.  Skin:    General: Skin is warm and dry.     Capillary Refill: Capillary refill takes less than 2 seconds.  Neurological:     General: No focal deficit present.     Mental Status: She is alert and oriented to person, place, and time.     Coordination: Coordination normal.     Gait: Gait normal.  Psychiatric:        Mood and Affect: Mood normal.  Behavior: Behavior normal.        Thought Content: Thought content normal.       Assessment & Plan:  Morbid obesity due to excess calories [facility]) Assessment & Plan: Patient reports fasting until afternoon. For lunch she includes high carbs with little to no protein. For dinner patient reported high protein intake with moderate intake of greens. For physical activities patient is incorporating  going back the gym includes cardio weight lifting 5 x times a week Patient gained 6 lbs since last visit LDL 86  not at goal , LDL Goal <70 Started patient on initial dose Wegovy 0.25 mg once weekly Discussed goals to target aim for a healthy weight Start incorporating healthy balanced meals that includes proteins, low carb  and vegetables. Follow up in 4 weeks  Orders: -     Semaglutide-Weight Management; Inject 0.25 mg into the skin once a week for 28 days.  Dispense: 2 mL; Refill: 0    Return in about 4 weeks (around 05/25/2022) for Weight Loss Mangment.   Cruzita Lederer Newman Nip, FNP

## 2022-04-27 NOTE — Patient Instructions (Signed)
        It was a pleasure meeting you today. - Please take medications as prescribed. - Follow up with your primary health provider if any health concerns arises. - If symptoms worsen please contact your primary care provider and/or visit the emergency department.

## 2022-04-27 NOTE — Assessment & Plan Note (Addendum)
Patient reports fasting until afternoon. For lunch she includes high carbs with little to no protein. For dinner patient reported high protein intake with moderate intake of greens. For physical activities patient is incorporating  going back the gym includes cardio weight lifting 5 x times a week Patient gained 6 lbs since last visit LDL 86  not at goal , LDL Goal <70 Started patient on initial dose Wegovy 0.25 mg once weekly Discussed goals to target aim for a healthy weight Start incorporating healthy balanced meals that includes proteins, low carb and vegetables. Follow up in 4 weeks

## 2022-04-29 ENCOUNTER — Telehealth: Payer: Self-pay | Admitting: Family Medicine

## 2022-04-29 NOTE — Telephone Encounter (Signed)
I have not heard from insurance yet, we have to wait on insurance response

## 2022-04-29 NOTE — Telephone Encounter (Signed)
[facility] said there is a hold on prescription tirzepatide (ZEPBOUND) 2.5 MG/0.5ML Pen [[id number]]   Can provider pass this hold.call pharmacy back please  Pharmacy  [facility] - [place], [place] - [address] [place], [place] [address] Phone: [phone]  Fax: [phone] DEA #: --

## 2022-04-29 NOTE — Telephone Encounter (Signed)
PA for this medication was done and looks like it was denied, please advice on alternative?

## 2022-05-02 NOTE — Telephone Encounter (Signed)
Prior Sandra Frost was denied per pt ozempic is approved by her insurance.

## 2022-05-04 MED ORDER — SEMAGLUTIDE-WEIGHT MANAGEMENT 0.25 MG/0.5ML ~~LOC~~ SOAJ
0.2500 mg | SUBCUTANEOUS | 0 refills | Status: AC
Start: 2022-05-04 — End: 2022-06-01

## 2022-05-04 NOTE — Addendum Note (Signed)
Addended by: Rica Records on: 05/04/2022 03:37 PM   Modules accepted: Orders

## 2022-05-04 NOTE — Telephone Encounter (Signed)
I sent in wegovy for obesity

## 2022-05-09 NOTE — Telephone Encounter (Signed)
Okay; thanks.

## 2022-05-09 NOTE — Telephone Encounter (Signed)
PA was denied also for wegovy since pt is not diabetic.

## 2022-05-19 ENCOUNTER — Ambulatory Visit: Payer: Medicaid Other | Admitting: Professional Counselor

## 2022-05-20 ENCOUNTER — Ambulatory Visit (INDEPENDENT_AMBULATORY_CARE_PROVIDER_SITE_OTHER): Payer: Medicaid Other | Admitting: Professional Counselor

## 2022-05-20 DIAGNOSIS — F330 Major depressive disorder, recurrent, mild: Secondary | ICD-10-CM

## 2022-05-20 NOTE — Patient Instructions (Signed)
Continue self-care plan.  Practice mindfulness.  Explore local resources for long term trauma therapy.

## 2022-05-20 NOTE — Telephone Encounter (Signed)
Called patient to advise that the PA was denied. Medicaid is not covering meds for weight loss unless they have a secondary comorbidity.

## 2022-05-20 NOTE — Telephone Encounter (Signed)
Pt came by the office in regard to PA on Ozempic wants a call back in regard
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
## 2022-05-25 ENCOUNTER — Ambulatory Visit: Payer: Medicaid Other | Admitting: Family Medicine

## 2022-06-02 ENCOUNTER — Ambulatory Visit (INDEPENDENT_AMBULATORY_CARE_PROVIDER_SITE_OTHER): Payer: Medicaid Other | Admitting: Family Medicine

## 2022-06-02 ENCOUNTER — Encounter: Payer: Self-pay | Admitting: Family Medicine

## 2022-06-02 VITALS — BP 112/75 | HR 71 | Ht 61.0 in | Wt 231.0 lb

## 2022-06-02 DIAGNOSIS — Z6841 Body Mass Index (BMI) 40.0 and over, adult: Secondary | ICD-10-CM

## 2022-06-02 MED ORDER — PHENTERMINE HCL 15 MG PO CAPS
15.0000 mg | ORAL_CAPSULE | ORAL | 0 refills | Status: DC
Start: 2022-06-02 — End: 2022-06-30

## 2022-06-02 NOTE — Patient Instructions (Signed)
        Great to see you today.   - Please take medications as prescribed. - Follow up with your primary health provider if any health concerns arises. - If symptoms worsen please contact your primary care provider and/or visit the emergency department.  

## 2022-06-02 NOTE — Assessment & Plan Note (Signed)
Patient Loss 10 lbs since last visit Started Phentermine 15 mg daily Follow up in 4 weeks Continued discussion adhering to weight loss plan, with strong emphasize on nutrition and exercise. Read food labels to know how many calories are in each serving , Increase water intake 2-3 L a day. Include more protein intake such as lean meat, poultry, fish. Increase fiber intake such as vegetables, whole grains, fruits, artichokes, green peas, broccoli, lentils and lima beans.

## 2022-06-02 NOTE — Progress Notes (Signed)
Patient Office Visit   Subjective   Patient ID: Sandra Frost, female    DOB: May 26, 1990  Age: 32 y.o. MRN: [id number]  CC:  Chief Complaint  Patient presents with   Weight Loss    Patient is here for weight loss f/u. No changes or concerns since last visit.     HPI Sandra Frost 32 year old female, presents to clinic for obesity management . She  has a past medical history of Bradycardia (2012), Headache, History of mastitis (08/27/2014), Infection, Mastitis, right, acute (08/20/2014), Migraine, and Ovarian cyst. For the details of today's visit, please refer to assessment and plan.   HPI    Outpatient Encounter Medications as of 06/02/2022  Medication Sig   fluticasone (FLONASE) 50 MCG/ACT nasal spray Place 2 sprays into both nostrils daily.   loratadine (CLARITIN) 10 MG tablet Take 1 tablet (10 mg total) by mouth daily.   MIRENA, 52 MG, 20 MCG/DAY IUD 1 each by Intrauterine route once.   nitroGLYCERIN (NITROSTAT) 0.4 MG SL tablet Place 1 tablet (0.4 mg total) under the tongue every 5 (five) minutes as needed for chest pain.   phentermine 15 MG capsule Take 1 capsule (15 mg total) by mouth every morning.   Prenatal Vit-Fe Fumarate-FA (PRENATAL MULTIVITAMIN) TABS tablet Take 1 tablet by mouth daily at 12 noon.   propranolol (INDERAL) 20 MG tablet Take 1 tablet (20 mg total) by mouth once as needed for up to 1 dose.   No facility-administered encounter medications on file as of 06/02/2022.    No past surgical history on file.  Review of Systems  Constitutional:  Negative for chills and fever.  Eyes:  Negative for blurred vision.  Respiratory:  Negative for cough.   Cardiovascular:  Negative for chest pain.  Gastrointestinal:  Negative for abdominal pain.  Genitourinary:  Negative for dysuria.  Neurological:  Negative for dizziness.      Objective    BP 112/75   Pulse 71   Ht 5\' 1"  (1.549 m)   Wt 231 lb (104.8 kg)   SpO2 98%   BMI 43.65 kg/m   Physical  Exam Vitals reviewed.  Constitutional:      General: She is not in acute distress.    Appearance: Normal appearance. She is not ill-appearing, toxic-appearing or diaphoretic.  HENT:     Head: Normocephalic.  Eyes:     General:        Right eye: No discharge.        Left eye: No discharge.     Conjunctiva/sclera: Conjunctivae normal.  Cardiovascular:     Rate and Rhythm: Normal rate.     Pulses: Normal pulses.     Heart sounds: Normal heart sounds.  Pulmonary:     Effort: Pulmonary effort is normal. No respiratory distress.     Breath sounds: Normal breath sounds.  Abdominal:     General: Bowel sounds are normal.     Palpations: Abdomen is soft.     Tenderness: There is no abdominal tenderness. There is no right CVA tenderness, left CVA tenderness or guarding.  Musculoskeletal:        General: Normal range of motion.     Cervical back: Normal range of motion.  Skin:    General: Skin is warm and dry.     Capillary Refill: Capillary refill takes less than 2 seconds.  Neurological:     General: No focal deficit present.     Mental Status: She is alert and oriented  to person, place, and time.     Coordination: Coordination normal.     Gait: Gait normal.  Psychiatric:        Mood and Affect: Mood normal.        Behavior: Behavior normal.       Assessment & Plan:  Obesity, Class III, BMI 40-49.9 (morbid obesity) (HCC) -     Phentermine HCl; Take 1 capsule (15 mg total) by mouth every morning.  Dispense: 30 capsule; Refill: 0  Morbid obesity due to excess calories [facility]) Assessment & Plan: Patient Loss 10 lbs since last visit Started Phentermine 15 mg daily Follow up in 4 weeks Continued discussion adhering to weight loss plan, with strong emphasize on nutrition and exercise. Read food labels to know how many calories are in each serving , Increase water intake 2-3 L a day. Include more protein intake such as lean meat, poultry, fish. Increase fiber intake such as vegetables,  whole grains, fruits, artichokes, green peas, broccoli, lentils and lima beans.      Return in about 4 weeks (around 06/30/2022) for Weight Loss Mangment.   Cruzita Lederer Newman Nip, FNP

## 2022-06-24 ENCOUNTER — Ambulatory Visit (INDEPENDENT_AMBULATORY_CARE_PROVIDER_SITE_OTHER): Payer: Medicaid Other | Admitting: Professional Counselor

## 2022-06-24 DIAGNOSIS — F330 Major depressive disorder, recurrent, mild: Secondary | ICD-10-CM

## 2022-06-24 NOTE — Patient Instructions (Signed)
Continue self-care plan.
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
## 2022-06-30 ENCOUNTER — Ambulatory Visit (INDEPENDENT_AMBULATORY_CARE_PROVIDER_SITE_OTHER): Payer: Medicaid Other | Admitting: Family Medicine

## 2022-06-30 ENCOUNTER — Encounter: Payer: Self-pay | Admitting: Family Medicine

## 2022-06-30 VITALS — BP 110/80 | HR 85 | Temp 99.2°F | Ht 61.0 in | Wt 221.0 lb

## 2022-06-30 DIAGNOSIS — Z6841 Body Mass Index (BMI) 40.0 and over, adult: Secondary | ICD-10-CM | POA: Diagnosis not present

## 2022-06-30 MED ORDER — PHENTERMINE HCL 37.5 MG PO CAPS
37.5000 mg | ORAL_CAPSULE | ORAL | 1 refills | Status: DC
Start: 2022-06-30 — End: 2022-09-20

## 2022-06-30 NOTE — Patient Instructions (Signed)
        Great to see you today.   - Please take medications as prescribed. - Follow up with your primary health provider if any health concerns arises. - If symptoms worsen please contact your primary care provider and/or visit the emergency department.  

## 2022-06-30 NOTE — Progress Notes (Signed)
Patient Office Visit   Subjective   Patient ID: Sandra Frost, female    DOB: 01/07/1990  Age: 32 y.o. MRN: [id number]  CC:  Chief Complaint  Patient presents with   Follow-up    Return in about 4 weeks (around 06/30/2022) for Weight Loss Mangement    HPI Sandra Frost 32 year old female, presents to the clinic for weight loss management.She  has a past medical history of Bradycardia (2012), Headache, History of mastitis (08/27/2014), Infection, Mastitis, right, acute (08/20/2014), Migraine, and Ovarian cyst. For the details of today's visit, please refer to assessment and plan.   HPI    Outpatient Encounter Medications as of 06/30/2022  Medication Sig   fluticasone (FLONASE) 50 MCG/ACT nasal spray Place 2 sprays into both nostrils daily.   loratadine (CLARITIN) 10 MG tablet Take 1 tablet (10 mg total) by mouth daily.   nitroGLYCERIN (NITROSTAT) 0.4 MG SL tablet Place 1 tablet (0.4 mg total) under the tongue every 5 (five) minutes as needed for chest pain.   phentermine 37.5 MG capsule Take 1 capsule (37.5 mg total) by mouth every morning.   propranolol (INDERAL) 20 MG tablet Take 1 tablet (20 mg total) by mouth once as needed for up to 1 dose.   [DISCONTINUED] phentermine 15 MG capsule Take 1 capsule (15 mg total) by mouth every morning.   MIRENA, 52 MG, 20 MCG/DAY IUD 1 each by Intrauterine route once. (Patient not taking: Reported on 06/30/2022)   Prenatal Vit-Fe Fumarate-FA (PRENATAL MULTIVITAMIN) TABS tablet Take 1 tablet by mouth daily at 12 noon. (Patient not taking: Reported on 06/30/2022)   No facility-administered encounter medications on file as of 06/30/2022.    No past surgical history on file.  Review of Systems  Constitutional:  Negative for chills and fever.  Respiratory:  Negative for shortness of breath.   Cardiovascular:  Negative for chest pain.  Gastrointestinal:  Negative for abdominal pain.  Genitourinary:  Negative for dysuria.  Musculoskeletal:   Negative for myalgias.  Neurological:  Negative for dizziness.  Psychiatric/Behavioral:  Negative for depression.       Objective    BP 110/80   Pulse 85   Temp 99.2 F (37.3 C) (Oral)   Ht 5\' 1"  (1.549 m)   Wt 221 lb (100.2 kg)   LMP 06/14/2022 (Approximate)   SpO2 97%   BMI 41.76 kg/m   Physical Exam Vitals reviewed.  Constitutional:      General: She is not in acute distress.    Appearance: Normal appearance. She is not ill-appearing, toxic-appearing or diaphoretic.  HENT:     Head: Normocephalic.  Eyes:     General:        Right eye: No discharge.        Left eye: No discharge.     Conjunctiva/sclera: Conjunctivae normal.  Cardiovascular:     Rate and Rhythm: Normal rate.     Pulses: Normal pulses.     Heart sounds: Normal heart sounds.  Pulmonary:     Effort: Pulmonary effort is normal. No respiratory distress.     Breath sounds: Normal breath sounds.  Musculoskeletal:        General: Normal range of motion.     Cervical back: Normal range of motion.  Skin:    General: Skin is warm and dry.     Capillary Refill: Capillary refill takes less than 2 seconds.  Neurological:     General: No focal deficit present.     Mental  Status: She is alert and oriented to person, place, and time.     Coordination: Coordination normal.     Gait: Gait normal.  Psychiatric:        Mood and Affect: Mood normal.        Behavior: Behavior normal.       Assessment & Plan:  Obesity, Class III, BMI 40-49.9 (morbid obesity) (HCC) -     Phentermine HCl; Take 1 capsule (37.5 mg total) by mouth every morning.  Dispense: 30 capsule; Refill: 1  Morbid obesity due to excess calories [facility]) Assessment & Plan: Patient tolerated phentermine 15 mg daily well with no adverse effects. 10 lb weight loss since our last visit Increased Phentermine 37.5 mg daily Continued discussion adhering to weight loss plan, with strong emphasize on nutrition and exercise. Read food labels to know how  many calories are in each serving , Increase water intake 2-3 L a day. Include more protein intake such as lean meat, poultry, fish. Increase fiber intake such as vegetables, whole grains, fruits, artichokes, green peas, broccoli, lentils and lima beans.       Return in about 2 months (around 08/30/2022), or if symptoms worsen or fail to improve, for Weight Loss Mangment Month 3 on pill.   Cruzita Lederer Newman Nip, FNP

## 2022-06-30 NOTE — Assessment & Plan Note (Addendum)
Patient tolerated phentermine 15 mg daily well with no adverse effects. 10 lb weight loss since our last visit Increased Phentermine 37.5 mg daily Continued discussion adhering to weight loss plan, with strong emphasize on nutrition and exercise. Read food labels to know how many calories are in each serving , Increase water intake 2-3 L a day. Include more protein intake such as lean meat, poultry, fish. Increase fiber intake such as vegetables, whole grains, fruits, artichokes, green peas, broccoli, lentils and lima beans.

## 2022-07-14 ENCOUNTER — Ambulatory Visit: Payer: Medicaid Other | Admitting: Professional Counselor

## 2022-07-28 ENCOUNTER — Ambulatory Visit: Payer: Medicaid Other | Admitting: Professional Counselor

## 2022-07-29 ENCOUNTER — Ambulatory Visit (INDEPENDENT_AMBULATORY_CARE_PROVIDER_SITE_OTHER): Payer: Medicaid Other | Admitting: Professional Counselor

## 2022-07-29 DIAGNOSIS — F330 Major depressive disorder, recurrent, mild: Secondary | ICD-10-CM
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
## 2022-08-12 ENCOUNTER — Ambulatory Visit: Payer: Medicaid Other | Admitting: Professional Counselor

## 2022-08-17 ENCOUNTER — Telehealth (INDEPENDENT_AMBULATORY_CARE_PROVIDER_SITE_OTHER): Payer: Medicaid Other | Admitting: Professional Counselor

## 2022-08-17 DIAGNOSIS — F330 Major depressive disorder, recurrent, mild: Secondary | ICD-10-CM

## 2022-08-17 NOTE — BH Specialist Note (Signed)
Behavioral Health Treatment Plan Team Note  MRN: [id number] NAME: Sandra Frost  DATE: 08/19/22  Start time: Start Time: 0355 End time: Stop Time: 0410 Total time: Total Time in Minutes (Visit): 15  Total number of Virtual BH Treatment Team Plan encounters: 2/4  Treatment Team Attendees: Dr. Vanetta Shawl and Esmond Harps  Diagnoses:    ICD-10-CM   1. Mild episode of recurrent major depressive disorder (HCC)  F33.0       Goals, Interventions and Follow-up Plan Goals: "need to learn how to deal with my emotions better"  Interventions: Solution-Focused Strategies, CBT and mindfulness Medication Management Recommendations: Na Follow-up Plan: Continue bi-weekly cbt therapy  History of the present illness Presenting Problem/Current Symptoms:  The patient is a 32 year old female returning for a collaborative care follow-up. She missed her last appointment due to a conflict with her ex-husband and feeling down. Her primary concern is the recent news that her job has filed for bankruptcy, leaving her unemployed with just one week's notice. This has led to increased anxiety and stress.    She continues to experience conflict with her ex-husband and ex-mother-in-law and is also stressed by the heavy workload of caring for her six children, as she holds most of the custody. Despite having an overall improved mood before losing her job, she still struggles with sleep, managing only three to five hours per night. She reports difficulty falling asleep, often staying up until two in the morning and needing to wake up by 5:30 a.m.   Although she is functioning relatively well under the circumstances, she is actively seeking new employment and has an interview scheduled for next week. During the session, the behavioral counselor discussed mindfulness techniques and Cognitive Behavioral Therapy (CBT) to help reduce her anxiety. They also talked about behavior activation, encouraging exercise, better eating  habits, and improved sleep hygiene. The patient expressed no interest in medication trials at this time. She will follow up in two weeks to review her progress and adjust the care plan as needed.  Screenings PHQ-9 Assessments:     07/29/2022    2:58 PM 06/30/2022    3:07 PM 06/24/2022   10:08 AM  Depression screen PHQ 2/9  Decreased Interest 2 1 0  Down, Depressed, Hopeless 3 1 0  PHQ - 2 Score 5 2 0  Altered sleeping 3 3 1   Tired, decreased energy 3 1 0  Change in appetite 1 0 0  Feeling bad or failure about yourself  0 0 0  Trouble concentrating 0 0 0  Moving slowly or fidgety/restless 0 0 0  Suicidal thoughts 0 0 0  PHQ-9 Score 12 6 1   Difficult doing work/chores Very difficult Somewhat difficult Not difficult at all   GAD-7 Assessments:     07/29/2022    2:59 PM 06/30/2022    3:07 PM 06/24/2022   10:10 AM 06/02/2022    3:02 PM  GAD 7 : Generalized Anxiety Score  Nervous, Anxious, on Edge 3 2 1 1   Control/stop worrying 2 2 0 0  Worry too much - different things 3 2 1 2   Trouble relaxing 3 2 1 2   Restless 1 2 0 1  Easily annoyed or irritable 1 1 0 1  Afraid - awful might happen 0 0 0 0  Total GAD 7 Score 13 11 3 7   Anxiety Difficulty Very difficult Very difficult Somewhat difficult Somewhat difficult    Past Medical History Past Medical History:  Diagnosis Date   Bradycardia 2012  one episode, 3-day hospitalization, no Rx   Headache    History of mastitis 08/27/2014   Infection    UTI   Mastitis, right, acute 08/20/2014   Migraine    Ovarian cyst     Vital signs: There were no vitals filed for this visit.  Allergies:  Allergies as of 08/17/2022 - Review Complete 06/30/2022  Allergen Reaction Noted   Penicillins Hives 12/02/2010    Medication History Current medications:  Outpatient Encounter Medications as of 08/17/2022  Medication Sig   fluticasone (FLONASE) 50 MCG/ACT nasal spray Place 2 sprays into both nostrils daily.   loratadine (CLARITIN) 10 MG  tablet Take 1 tablet (10 mg total) by mouth daily.   MIRENA, 52 MG, 20 MCG/DAY IUD 1 each by Intrauterine route once. (Patient not taking: Reported on 06/30/2022)   nitroGLYCERIN (NITROSTAT) 0.4 MG SL tablet Place 1 tablet (0.4 mg total) under the tongue every 5 (five) minutes as needed for chest pain.   phentermine 37.5 MG capsule Take 1 capsule (37.5 mg total) by mouth every morning.   Prenatal Vit-Fe Fumarate-FA (PRENATAL MULTIVITAMIN) TABS tablet Take 1 tablet by mouth daily at 12 noon. (Patient not taking: Reported on 06/30/2022)   propranolol (INDERAL) 20 MG tablet Take 1 tablet (20 mg total) by mouth once as needed for up to 1 dose.   No facility-administered encounter medications on file as of 08/17/2022.     Scribe for Treatment Team: Reuel Boom

## 2022-08-18 ENCOUNTER — Ambulatory Visit: Payer: Medicaid Other | Admitting: Professional Counselor

## 2022-09-20 ENCOUNTER — Encounter: Payer: Self-pay | Admitting: Family Medicine

## 2022-09-20 ENCOUNTER — Other Ambulatory Visit: Payer: Self-pay | Admitting: Family Medicine

## 2022-09-20 MED ORDER — PHENTERMINE HCL 37.5 MG PO CAPS: 37.5000 mg | ORAL_CAPSULE | ORAL | 1 refills | Status: DC

## 2022-09-21 ENCOUNTER — Telehealth: Payer: Self-pay | Admitting: Family Medicine

## 2022-09-21 NOTE — Telephone Encounter (Signed)
Sandra Frost at [facility] called in on patient behalf.  Since pt is a new customer to [employer] new script for phentermine 37.5 MG capsule   Will need to be sent in to [facility] to have filled.  Original request was sent in to [facility] and can not be filled

## 2022-09-21 NOTE — Telephone Encounter (Signed)
[facility] pharmacy

## 2022-09-21 NOTE — Telephone Encounter (Signed)
Which pharmacy should I send her medication?

## 2022-09-22 ENCOUNTER — Other Ambulatory Visit: Payer: Self-pay | Admitting: Family Medicine

## 2022-09-22 MED ORDER — PHENTERMINE HCL 37.5 MG PO CAPS: 37.5000 mg | ORAL_CAPSULE | ORAL | 1 refills | Status: DC

## 2022-09-22 NOTE — Telephone Encounter (Signed)
Sent to [facility].  ?

## 2022-09-23 ENCOUNTER — Ambulatory Visit: Payer: Medicaid Other | Admitting: Professional Counselor

## 2022-09-23 ENCOUNTER — Encounter: Payer: Self-pay | Admitting: Professional Counselor

## 2022-09-23 DIAGNOSIS — F330 Major depressive disorder, recurrent, mild: Secondary | ICD-10-CM

## 2022-09-23 NOTE — BH Specialist Note (Signed)
[place] Virtual BH Telephone Follow-up  MRN: [id number] NAME: Doneisha Montello Date: 09/23/22  Start time: Start Time: 0200 End time: Stop Time: 0230 Total time: Total Time in Minutes (Visit): 30 Call number: Visit Number: Additional Visit  Reason for call today:  The patient is a 32 year old female who returned for a collaborative care follow-up, approximately a month after her last visit. She continues to face significant challenges, having recently lost her job and her home. Unable to pay for her housing, she was forced to move in with her mother, with whom she has had a strained relationship. Her mother agreed to let her and her six children live with her, which has made for a difficult transition.  The patient reports ongoing sleep difficulties, as well as symptoms of anxiety and depression. She struggles with emotional expression, particularly in processing sadness, and tends to suppress her emotions. We discussed the nature of trauma responses and emotional dysregulation during the session. To support her, I introduced behavioral activation strategies and motivational interviewing techniques to help her navigate her current challenges.  Her primary goal is to start fresh, rebuild her life, and save money. She is seeking emotional support during this difficult time and aims to maintain stability for herself and her children. We will follow up in two weeks in person to monitor her progress and provide continued support.   PHQ-9 Scores:     09/23/2022    3:05 PM 07/29/2022    2:58 PM 06/30/2022    3:07 PM 06/24/2022   10:08 AM 06/02/2022    3:02 PM  Depression screen PHQ 2/9  Decreased Interest 2 2 1  0 1  Down, Depressed, Hopeless 2 3 1  0 0  PHQ - 2 Score 4 5 2  0 1  Altered sleeping 3 3 3 1  0  Tired, decreased energy 2 3 1  0 2  Change in appetite 1 1 0 0 2  Feeling bad or failure about yourself  2 0 0 0 0  Trouble concentrating 1 0 0 0 1  Moving slowly or fidgety/restless 0 0 0 0 0   Suicidal thoughts 0 0 0 0 0  PHQ-9 Score 13 12 6 1 6   Difficult doing work/chores Somewhat difficult Very difficult Somewhat difficult Not difficult at all Somewhat difficult   GAD-7 Scores:     09/23/2022    3:06 PM 07/29/2022    2:59 PM 06/30/2022    3:07 PM 06/24/2022   10:10 AM  GAD 7 : Generalized Anxiety Score  Nervous, Anxious, on Edge 3 3 2 1   Control/stop worrying 2 2 2  0  Worry too much - different things 3 3 2 1   Trouble relaxing 3 3 2 1   Restless 1 1 2  0  Easily annoyed or irritable 1 1 1  0  Afraid - awful might happen 0 0 0 0  Total GAD 7 Score 13 13 11 3   Anxiety Difficulty Somewhat difficult Very difficult Very difficult Somewhat difficult    Stress Current stressors:  Financial stress, Eviction, Kids Sleep:  Poor Appetite:  Fair Coping ability:  Fair Patient taking medications as prescribed:  NA  Current medications:  Outpatient Encounter Medications as of 09/23/2022  Medication Sig   fluticasone (FLONASE) 50 MCG/ACT nasal spray Place 2 sprays into both nostrils daily.   loratadine (CLARITIN) 10 MG tablet Take 1 tablet (10 mg total) by mouth daily.   MIRENA, 52 MG, 20 MCG/DAY IUD 1 each by Intrauterine route once. (Patient not taking: Reported  on 06/30/2022)   nitroGLYCERIN (NITROSTAT) 0.4 MG SL tablet Place 1 tablet (0.4 mg total) under the tongue every 5 (five) minutes as needed for chest pain.   phentermine 37.5 MG capsule Take 1 capsule (37.5 mg total) by mouth every morning.   Prenatal Vit-Fe Fumarate-FA (PRENATAL MULTIVITAMIN) TABS tablet Take 1 tablet by mouth daily at 12 noon. (Patient not taking: Reported on 06/30/2022)   propranolol (INDERAL) 20 MG tablet Take 1 tablet (20 mg total) by mouth once as needed for up to 1 dose.   No facility-administered encounter medications on file as of 09/23/2022.     Self-harm Behaviors Risk Assessment Self-harm risk factors:  None Patient endorses recent thoughts of harming self:  Denies   Danger to Others Risk  Assessment Danger to others risk factors:  None Patient endorses recent thoughts of harming others:  Denies  Goals, Interventions and Follow-up Plan Goals: Increase healthy adjustment to current life circumstances Interventions: Mindfulness or Relaxation Training, Behavioral Activation, and CBT Cognitive Behavioral Therapy Follow-up Plan:  Follow up in 2 weeks   Reuel Boom

## 2022-09-23 NOTE — Telephone Encounter (Signed)
Pt has been informed.

## 2022-09-23 NOTE — Patient Instructions (Signed)
If your symptoms worsen or you have thoughts of suicide/homicide, PLEASE SEEK IMMEDIATE MEDICAL ATTENTION.  You may always call:   National Suicide Hotline: 988 or 413-579-8649 [place] Crisis Line: 302-173-3551 Crisis Recovery in [place]: [phone]) 416-6939     These are available 24 hours a day, 7 days a week.

## 2022-09-26 NOTE — Telephone Encounter (Signed)
Visit was completed on 09/20

## 2022-09-30 ENCOUNTER — Ambulatory Visit: Payer: Medicaid Other | Admitting: Family Medicine

## 2022-10-04 ENCOUNTER — Ambulatory Visit: Payer: Medicaid Other | Admitting: Family Medicine

## 2022-10-06 ENCOUNTER — Ambulatory Visit: Payer: Medicaid Other | Admitting: Professional Counselor

## 2022-10-06 DIAGNOSIS — F330 Major depressive disorder, recurrent, mild: Secondary | ICD-10-CM

## 2022-10-06 NOTE — BH Specialist Note (Signed)
[place] Virtual BH Telephone Follow-up  MRN: [id number] NAME: Sandra Frost Date: 10/10/22  Start time: Start Time: 0200 End time: Stop Time: 0230 Total time: Total Time in Minutes (Visit): 30 Call number: Visit Number: Additional Visit  Reason for call today:  The patient is a 32 year old female who presented for a collaborative care follow-up, conducted virtually via a telehealth platform. After the limitations of telehealth were explained, the patient agreed to proceed. Despite still being in a state of crisis and transition due to recent life changes, including the loss of her home, ongoing custody disagreements, and a recent divorce, the patient presented in a relatively positive mood. She recently moved in with her mother after being unable to pay rent due to losing her job but has since secured new employment. She finds the experience of reconnecting with her mother beneficial, as they are now rebuilding their previously strained relationship.  The patient continues to experience difficulty sleeping and reports being open to trying sleep medication, which will be discussed with her primary care physician to explore appropriate options. Overall, she is coping fairly well, working on being more open and honest in her communication with loved ones, though she still struggles with suppressing emotions at times. The focus will remain on helping her address this. The patient denies any suicidal thoughts, plans, or intentions. A follow-up session is scheduled in two weeks, in person.  PHQ-9 Scores:     09/23/2022    3:05 PM 07/29/2022    2:58 PM 06/30/2022    3:07 PM 06/24/2022   10:08 AM 06/02/2022    3:02 PM  Depression screen PHQ 2/9  Decreased Interest 2 2 1  0 1  Down, Depressed, Hopeless 2 3 1  0 0  PHQ - 2 Score 4 5 2  0 1  Altered sleeping 3 3 3 1  0  Tired, decreased energy 2 3 1  0 2  Change in appetite 1 1 0 0 2  Feeling bad or failure about yourself  2 0 0 0 0  Trouble  concentrating 1 0 0 0 1  Moving slowly or fidgety/restless 0 0 0 0 0  Suicidal thoughts 0 0 0 0 0  PHQ-9 Score 13 12 6 1 6   Difficult doing work/chores Somewhat difficult Very difficult Somewhat difficult Not difficult at all Somewhat difficult   GAD-7 Scores:     09/23/2022    3:06 PM 07/29/2022    2:59 PM 06/30/2022    3:07 PM 06/24/2022   10:10 AM  GAD 7 : Generalized Anxiety Score  Nervous, Anxious, on Edge 3 3 2 1   Control/stop worrying 2 2 2  0  Worry too much - different things 3 3 2 1   Trouble relaxing 3 3 2 1   Restless 1 1 2  0  Easily annoyed or irritable 1 1 1  0  Afraid - awful might happen 0 0 0 0  Total GAD 7 Score 13 13 11 3   Anxiety Difficulty Somewhat difficult Very difficult Very difficult Somewhat difficult    Stress Current stressors:  Ex husband and living  Sleep:  Poor Appetite:  Good Coping ability:  Good Patient taking medications as prescribed:  NA  Current medications:  Outpatient Encounter Medications as of 10/06/2022  Medication Sig   fluticasone (FLONASE) 50 MCG/ACT nasal spray Place 2 sprays into both nostrils daily.   loratadine (CLARITIN) 10 MG tablet Take 1 tablet (10 mg total) by mouth daily.   MIRENA, 52 MG, 20 MCG/DAY IUD 1 each by Intrauterine  route once. (Patient not taking: Reported on 06/30/2022)   nitroGLYCERIN (NITROSTAT) 0.4 MG SL tablet Place 1 tablet (0.4 mg total) under the tongue every 5 (five) minutes as needed for chest pain.   phentermine 37.5 MG capsule Take 1 capsule (37.5 mg total) by mouth every morning.   Prenatal Vit-Fe Fumarate-FA (PRENATAL MULTIVITAMIN) TABS tablet Take 1 tablet by mouth daily at 12 noon. (Patient not taking: Reported on 06/30/2022)   propranolol (INDERAL) 20 MG tablet Take 1 tablet (20 mg total) by mouth once as needed for up to 1 dose.   No facility-administered encounter medications on file as of 10/06/2022.    Self-harm Behaviors Risk Assessment Self-harm risk factors:  None Patient endorses recent  thoughts of harming self:  Denies   Danger to Others Risk Assessment Danger to others risk factors:  None Patient endorses recent thoughts of harming others: Denies     Goals, Interventions and Follow-up Plan Goals: Increase healthy adjustment to current life circumstances Interventions: Behavioral Activation and CBT Cognitive Behavioral Therapy Follow-up Plan:  2 week follow up   Reuel Boom

## 2022-10-07 ENCOUNTER — Encounter: Payer: Self-pay | Admitting: Family Medicine

## 2022-10-21 ENCOUNTER — Ambulatory Visit: Payer: Medicaid Other | Admitting: Professional Counselor

## 2022-11-11 ENCOUNTER — Ambulatory Visit (INDEPENDENT_AMBULATORY_CARE_PROVIDER_SITE_OTHER): Payer: Medicaid Other | Admitting: Professional Counselor

## 2022-11-11 DIAGNOSIS — F330 Major depressive disorder, recurrent, mild: Secondary | ICD-10-CM

## 2022-11-13 NOTE — BH Specialist Note (Signed)
[place] Virtual BH Telephone Follow-up  MRN: [id number] NAME: Sandra Frost Date: 11/13/22  Start time: Start Time: 1200 End time: Stop Time: 1235 Total time: Total Time in Minutes (Visit): 35 Call number: Visit Number: Additional Visit  Reason for call today:  The patient is a 32 year old female seen for a collaborative care follow-up session via telehealth. After discussing the limitations of the platform, she agreed to proceed. She has been navigating a difficult period, including the loss of her job and home, ongoing custody issues, and divorce-related challenges. Despite these stressors, she has been handling the adjustment relatively well. Recently, she decided to quit drinking, recognizing that alcohol had become a way of avoiding her problems, and is focusing on a "cleansing period" to regain clarity and focus.  The patient is now employed and living with her mother. While this arrangement brings its own challenges, it has also helped them reconnect and improve their previously strained relationship. Overall, she reports a stable mood but continues to struggle with sleep issues and persistent worry about court proceedings, child support, and financial stress.   Nevertheless, her attitude remains positive, and she expressed a strong desire to reengage in therapy with a more structured approach. To support this goal, we plan to meet bi-weekly to provide consistent support, work on mindfulness, and help her stay more present and active in her daily life. We will monitor her sleep patterns and stress levels, implementing coping strategies as needed, and reassess her progress in the next session.  PHQ-9 Scores:     11/13/2022   11:30 AM 09/23/2022    3:05 PM 07/29/2022    2:58 PM 06/30/2022    3:07 PM 06/24/2022   10:08 AM  Depression screen PHQ 2/9  Decreased Interest 1 2 2 1  0  Down, Depressed, Hopeless 1 2 3 1  0  PHQ - 2 Score 2 4 5 2  0  Altered sleeping 2 3 3 3 1   Tired, decreased  energy 1 2 3 1  0  Change in appetite 0 1 1 0 0  Feeling bad or failure about yourself  1 2 0 0 0  Trouble concentrating 1 1 0 0 0  Moving slowly or fidgety/restless 0 0 0 0 0  Suicidal thoughts 0 0 0 0 0  PHQ-9 Score 7 13 12 6 1   Difficult doing work/chores Somewhat difficult Somewhat difficult Very difficult Somewhat difficult Not difficult at all   GAD-7 Scores:     11/13/2022   11:31 AM 09/23/2022    3:06 PM 07/29/2022    2:59 PM 06/30/2022    3:07 PM  GAD 7 : Generalized Anxiety Score  Nervous, Anxious, on Edge 1 3 3 2   Control/stop worrying 2 2 2 2   Worry too much - different things 1 3 3 2   Trouble relaxing 1 3 3 2   Restless 1 1 1 2   Easily annoyed or irritable 1 1 1 1   Afraid - awful might happen 1 0 0 0  Total GAD 7 Score 8 13 13 11   Anxiety Difficulty Somewhat difficult Somewhat difficult Very difficult Very difficult    Stress Current stressors:  Child support, custody battle, 6 kids Sleep:  Fair Appetite:  Good Coping ability:  Good Patient taking medications as prescribed:  n/a  Current medications:  Outpatient Encounter Medications as of 11/11/2022  Medication Sig   fluticasone (FLONASE) 50 MCG/ACT nasal spray Place 2 sprays into both nostrils daily.   loratadine (CLARITIN) 10 MG tablet Take 1 tablet (  10 mg total) by mouth daily.   MIRENA, 52 MG, 20 MCG/DAY IUD 1 each by Intrauterine route once. (Patient not taking: Reported on 06/30/2022)   nitroGLYCERIN (NITROSTAT) 0.4 MG SL tablet Place 1 tablet (0.4 mg total) under the tongue every 5 (five) minutes as needed for chest pain.   phentermine 37.5 MG capsule Take 1 capsule (37.5 mg total) by mouth every morning.   Prenatal Vit-Fe Fumarate-FA (PRENATAL MULTIVITAMIN) TABS tablet Take 1 tablet by mouth daily at 12 noon. (Patient not taking: Reported on 06/30/2022)   propranolol (INDERAL) 20 MG tablet Take 1 tablet (20 mg total) by mouth once as needed for up to 1 dose.   No facility-administered encounter medications  on file as of 11/11/2022.    Self-harm Behaviors Risk Assessment Self-harm risk factors:  None Patient endorses recent thoughts of harming self:  Denies   Danger to Others Risk Assessment Danger to others risk factors:  None Patient endorses recent thoughts of harming others:  Denies   Goals, Interventions and Follow-up Plan Goals:  Increase exercise, self care and decrease stress Interventions: Mindfulness or Relaxation Training, Behavioral Activation, and CBT Cognitive Behavioral Therapy Follow-up Plan:  Bi-weekly counseling   Reuel Boom

## 2022-11-13 NOTE — Patient Instructions (Addendum)
Next appointment 11/21 at 12:00 pm virtual.

## 2022-11-24 ENCOUNTER — Ambulatory Visit: Payer: Medicaid Other | Admitting: Professional Counselor

## 2022-11-24 DIAGNOSIS — F330 Major depressive disorder, recurrent, mild: Secondary | ICD-10-CM | POA: Diagnosis not present

## 2022-11-25 ENCOUNTER — Other Ambulatory Visit: Payer: Self-pay | Admitting: Family Medicine

## 2022-11-25 MED ORDER — TRAZODONE HCL 50 MG PO TABS: 50.0000 mg | ORAL_TABLET | Freq: Every evening | ORAL | 3 refills | Status: AC | PRN

## 2022-11-25 NOTE — BH Specialist Note (Signed)
[facility] Telephone Follow-up  MRN: [id number] NAME: Sandra Frost Date: 11/25/22  Start time: Start Time: 1200 End time: Stop Time: 1230 Total time: Total Time in Minutes (Visit): 30 Call number: Visit Number: 3- Third Visit  Reason for call today:  The patient is a 32 year old female who presented for a Collaborative Care follow-up conducted via telehealth. She acknowledged and understood the limitations of virtual care. The patient appeared in a relatively positive mood, reporting steady improvement as she adjusts to living with her mother. While pending court and child custody battles continue to contribute to her stress, she is managing these challenges at a better pace. She is working full-time, functioning well, and engaging in counseling.  The patient mentioned ongoing sleep difficulties, primarily due to trouble winding down after long days managing work and caring for her six children. Notably, she is now open to trying medication for sleep, a significant shift as she previously expressed concerns about dependency. Plans are in place to consult her primary care physician to discuss a possible medication trial.  The patient continues to benefit from counseling interventions, including CBT, behavioral activation, and mindfulness techniques, which support her through this period of significant transition following her divorce and custody arrangements. Bi-weekly follow-ups are planned to monitor her progress and evaluate the potential impact of medication. Overall, she maintains a positive mindset and remains committed to her improvement journey.  PHQ-9 Scores:     11/13/2022   11:30 AM 09/23/2022    3:05 PM 07/29/2022    2:58 PM 06/30/2022    3:07 PM 06/24/2022   10:08 AM  Depression screen PHQ 2/9  Decreased Interest 1 2 2 1  0  Down, Depressed, Hopeless 1 2 3 1  0  PHQ - 2 Score 2 4 5 2  0  Altered sleeping 2 3 3 3 1   Tired, decreased energy 1 2 3 1  0  Change in appetite  0 1 1 0 0  Feeling bad or failure about yourself  1 2 0 0 0  Trouble concentrating 1 1 0 0 0  Moving slowly or fidgety/restless 0 0 0 0 0  Suicidal thoughts 0 0 0 0 0  PHQ-9 Score 7 13 12 6 1   Difficult doing work/chores Somewhat difficult Somewhat difficult Very difficult Somewhat difficult Not difficult at all   GAD-7 Scores:     11/13/2022   11:31 AM 09/23/2022    3:06 PM 07/29/2022    2:59 PM 06/30/2022    3:07 PM  GAD 7 : Generalized Anxiety Score  Nervous, Anxious, on Edge 1 3 3 2   Control/stop worrying 2 2 2 2   Worry too much - different things 1 3 3 2   Trouble relaxing 1 3 3 2   Restless 1 1 1 2   Easily annoyed or irritable 1 1 1 1   Afraid - awful might happen 1 0 0 0  Total GAD 7 Score 8 13 13 11   Anxiety Difficulty Somewhat difficult Somewhat difficult Very difficult Very difficult    Stress Current stressors:  Work, Public affairs consultant, custody battle Sleep:  Poor Appetite:  Good Coping ability:  Good Patient taking medications as prescribed:  NA  Current medications:  Outpatient Encounter Medications as of 11/24/2022  Medication Sig   fluticasone (FLONASE) 50 MCG/ACT nasal spray Place 2 sprays into both nostrils daily.   loratadine (CLARITIN) 10 MG tablet Take 1 tablet (10 mg total) by mouth daily.   MIRENA, 52 MG, 20 MCG/DAY IUD 1 each by Intrauterine route  once. (Patient not taking: Reported on 06/30/2022)   nitroGLYCERIN (NITROSTAT) 0.4 MG SL tablet Place 1 tablet (0.4 mg total) under the tongue every 5 (five) minutes as needed for chest pain.   phentermine 37.5 MG capsule Take 1 capsule (37.5 mg total) by mouth every morning.   Prenatal Vit-Fe Fumarate-FA (PRENATAL MULTIVITAMIN) TABS tablet Take 1 tablet by mouth daily at 12 noon. (Patient not taking: Reported on 06/30/2022)   propranolol (INDERAL) 20 MG tablet Take 1 tablet (20 mg total) by mouth once as needed for up to 1 dose.   No facility-administered encounter medications on file as of 11/24/2022.     Self-harm  Behaviors Risk Assessment Self-harm risk factors:  Past self harm Patient endorses recent thoughts of harming self:  Denies   Danger to Others Risk Assessment Danger to others risk factors:  None Patient endorses recent thoughts of harming others:  Denies   Substance Use Assessment Patient recently consumed alcohol:  No. Sober 2 months  Alcohol Use Disorder Identification Test (AUDIT):     07/29/2022    3:15 PM  Alcohol Use Disorder Test (AUDIT)  1. How often do you have a drink containing alcohol? 2  2. How many drinks containing alcohol do you have on a typical day when you are drinking? 1  3. How often do you have six or more drinks on one occasion? 0  AUDIT-C Score 3  4. How often during the last year have you found that you were not able to stop drinking once you had started? 0  5. How often during the last year have you failed to do what was normally expected from you because of drinking? 0  6. How often during the last year have you needed a first drink in the morning to get yourself going after a heavy drinking session? 0  7. How often during the last year have you had a feeling of guilt of remorse after drinking? 0  9. Have you or someone else been injured as a result of your drinking? 0  10. Has a relative or friend or a doctor or another health worker been concerned about your drinking or suggested you cut down? 0  Alcohol Use Disorder Identification Test Final Score (AUDIT) 3    Goals, Interventions and Follow-up Plan Goals:  Improve sleep and self care Interventions: Mindfulness or Relaxation Training, Behavioral Activation, and CBT Cognitive Behavioral Therapy Follow-up Plan:  Bi-weekly therapy   Reuel Boom

## 2022-12-23 ENCOUNTER — Ambulatory Visit (INDEPENDENT_AMBULATORY_CARE_PROVIDER_SITE_OTHER): Payer: Medicaid Other | Admitting: Professional Counselor

## 2022-12-23 DIAGNOSIS — F330 Major depressive disorder, recurrent, mild: Secondary | ICD-10-CM | POA: Diagnosis not present

## 2022-12-23 NOTE — BH Specialist Note (Unsigned)
[facility] Telephone Follow-up  MRN: [id number] NAME: Sandra Frost Date: 12/23/22  Start time: Start Time: 0930 End time: Stop Time: 1005 Total time: Total Time in Minutes (Visit): 35 Call number: Visit Number: 4- Fourth Visit  Reason for call today:  The patient is a 32 year old female who presented for a collaborative care follow-up. She continues to demonstrate an improved mood and coping abilities as she adjusts to significant life changes, including a divorce, moving, and job transitions. While she reports ongoing poor sleep, this appears to be related to staying up too late and waking early. Despite a prescription for Trazodone being sent previously, she has not taken it, expressing a strong preference to avoid medication if possible.  The patient continues to function well overall, balancing a full-time job and the care of her six children, though she acknowledges the stress involved. She finds the collaborative care sessions beneficial and appreciates the opportunity to process her feelings and emotions in a supportive environment. She prefers bi-weekly or monthly check-ins and feels comfortable with this therapeutic approach. A follow-up appointment will be scheduled in two weeks to continue supporting her through this transitional period and work toward eventual discharge from care.  PHQ-9 Scores:     12/23/2022    9:38 AM 11/13/2022   11:30 AM 09/23/2022    3:05 PM 07/29/2022    2:58 PM 06/30/2022    3:07 PM  Depression screen PHQ 2/9  Decreased Interest 0 1 2 2 1   Down, Depressed, Hopeless 0 1 2 3 1   PHQ - 2 Score 0 2 4 5 2   Altered sleeping 0 2 3 3 3   Tired, decreased energy 0 1 2 3 1   Change in appetite 1 0 1 1 0  Feeling bad or failure about yourself  0 1 2 0 0  Trouble concentrating 2 1 1  0 0  Moving slowly or fidgety/restless 0 0 0 0 0  Suicidal thoughts 0 0 0 0 0  PHQ-9 Score 3 7 13 12 6   Difficult doing work/chores Somewhat difficult Somewhat difficult  Somewhat difficult Very difficult Somewhat difficult   GAD-7 Scores:     12/23/2022    9:39 AM 11/13/2022   11:31 AM 09/23/2022    3:06 PM 07/29/2022    2:59 PM  GAD 7 : Generalized Anxiety Score  Nervous, Anxious, on Edge 3 1 3 3   Control/stop worrying 1 2 2 2   Worry too much - different things 1 1 3 3   Trouble relaxing 1 1 3 3   Restless 0 1 1 1   Easily annoyed or irritable 0 1 1 1   Afraid - awful might happen 0 1 0 0  Total GAD 7 Score 6 8 13 13   Anxiety Difficulty Somewhat difficult Somewhat difficult Somewhat difficult Very difficult    Stress Current stressors:   Sleep:   Appetite:   Coping ability:   Patient taking medications as prescribed:    Current medications:  Outpatient Encounter Medications as of 12/23/2022  Medication Sig   fluticasone (FLONASE) 50 MCG/ACT nasal spray Place 2 sprays into both nostrils daily.   loratadine (CLARITIN) 10 MG tablet Take 1 tablet (10 mg total) by mouth daily.   MIRENA, 52 MG, 20 MCG/DAY IUD 1 each by Intrauterine route once. (Patient not taking: Reported on 06/30/2022)   nitroGLYCERIN (NITROSTAT) 0.4 MG SL tablet Place 1 tablet (0.4 mg total) under the tongue every 5 (five) minutes as needed for chest pain.   phentermine 37.5 MG  capsule Take 1 capsule (37.5 mg total) by mouth every morning.   Prenatal Vit-Fe Fumarate-FA (PRENATAL MULTIVITAMIN) TABS tablet Take 1 tablet by mouth daily at 12 noon. (Patient not taking: Reported on 06/30/2022)   propranolol (INDERAL) 20 MG tablet Take 1 tablet (20 mg total) by mouth once as needed for up to 1 dose.   traZODone (DESYREL) 50 MG tablet Take 1 tablet (50 mg total) by mouth at bedtime as needed.   No facility-administered encounter medications on file as of 12/23/2022.     Self-harm Behaviors Risk Assessment Self-harm risk factors:  None current Patient endorses recent thoughts of harming self:  Denies   Danger to Others Risk Assessment Danger to others risk factors:  None Patient  endorses recent thoughts of harming others:  Denies   Substance Use Assessment Patient recently consumed alcohol:  None  Alcohol Use Disorder Identification Test (AUDIT):     07/29/2022    3:15 PM  Alcohol Use Disorder Test (AUDIT)  1. How often do you have a drink containing alcohol? 2  2. How many drinks containing alcohol do you have on a typical day when you are drinking? 1  3. How often do you have six or more drinks on one occasion? 0  AUDIT-C Score 3  4. How often during the last year have you found that you were not able to stop drinking once you had started? 0  5. How often during the last year have you failed to do what was normally expected from you because of drinking? 0  6. How often during the last year have you needed a first drink in the morning to get yourself going after a heavy drinking session? 0  7. How often during the last year have you had a feeling of guilt of remorse after drinking? 0  9. Have you or someone else been injured as a result of your drinking? 0  10. Has a relative or friend or a doctor or another health worker been concerned about your drinking or suggested you cut down? 0  Alcohol Use Disorder Identification Test Final Score (AUDIT) 3     Goals, Interventions and Follow-up Plan Goals: Healthy adjustment to current challenges Interventions: Behavioral Activation and CBT Cognitive Behavioral Therapy Follow-up Plan:  bi-weekly to monthly visits    Reuel Boom

## 2023-01-09 ENCOUNTER — Ambulatory Visit: Payer: Self-pay | Admitting: Family Medicine

## 2023-01-09 ENCOUNTER — Encounter: Payer: Self-pay | Admitting: Family Medicine

## 2023-01-09 ENCOUNTER — Telehealth: Payer: Self-pay

## 2023-01-09 NOTE — Telephone Encounter (Signed)
 Spoke to pt. Also sent MyChart message to schedule an office appointment for evaluation and labs/

## 2023-01-09 NOTE — Telephone Encounter (Signed)
   Chief Complaint: vaginal discharge Symptoms: cottage-cheese discharge, vaginal itching Frequency: x 2 days Pertinent Negatives: Patient denies fever, abdominal pain Disposition: [] ED /[x] Urgent Care (no appt availability in office) / [] Appointment(In office/virtual)/ []  [facility]/ [] Home Care/ [] Refused Recommended Disposition /[] [facility]/ []  Follow-up with PCP Additional Notes: Patient reports she has been experiencing vaginal cottage-cheese discharge and itching x 2 days. Patient reports she believes she has a yeast infection. Per protocol, this RN attempted to schedule in office appt. No availablity until 1/17. This RN offered to attempt to schedule UC appt to be seen sooner, patient refused and states she will attempt appt with her OBGYN office. This RN advised patient to call back or go to UC with worsening symptoms if unable to be seen by OBGYN. Patient verbalized understanding.     Copied from CRM [id number]. Topic: Clinical - Red Word Triage >> Jan 09, 2023  4:21 PM Laurier C wrote: Red Word that prompted transfer to Nurse Triage: Patient has possible UTI with discharge Reason for Disposition  [1] Symptoms of a yeast infection (i.e., itchy, white discharge, not bad smelling) AND [2] not improved > 3 days following Care Advice  Answer Assessment - Initial Assessment Questions 1. DISCHARGE: Describe the discharge. (e.g., white, yellow, green, gray, foamy, cottage cheese-like)     Cottage cheese 2. ODOR: Is there a bad odor?     none 3. ONSET: When did the discharge begin?     Day 2 4. RASH: Is there a rash in the genital area? If Yes, ask: Describe it. (e.g., redness, blisters, sores, bumps)     none 5. ABDOMEN PAIN: Are you having any abdomen pain? If Yes, ask: What does it feel like?  (e.g., crampy, dull, intermittent, constant)      none 6. ABDOMEN PAIN SEVERITY: If present, ask: How bad is it? (e.g., Scale 1-10; mild, moderate, or  severe)   - MILD (1-3): Doesn't interfere with normal activities, abdomen soft and not tender to touch.    - MODERATE (4-7): Interferes with normal activities or awakens from sleep, abdomen tender to touch.    - SEVERE (8-10): Excruciating pain, doubled over, unable to do any normal activities. (R/O peritonitis)      none 7. CAUSE: What do you think is causing the discharge? Have you had the same problem before? What happened then?     I think I have a yeast infection 8. OTHER SYMPTOMS: Do you have any other symptoms? (e.g., fever, itching, vaginal bleeding, pain with urination, injury to genital area, vaginal foreign body)     itching  Protocols used: Vaginal Discharge-A-AH

## 2023-01-09 NOTE — Telephone Encounter (Signed)
 Pt. Has been asked to schedule an office visit for evaluation.

## 2023-01-09 NOTE — Telephone Encounter (Signed)
 Copied from CRM [id number]. Topic: Clinical - Request for Lab/Test Order >> Jan 06, 2023  1:23 PM Gildardo Pounds wrote: Reason for CRM: Patient needs orders for labs to be drawn. Callback number is  [phone].

## 2023-04-14 ENCOUNTER — Ambulatory Visit: Admitting: Professional Counselor

## 2023-04-14 DIAGNOSIS — F330 Major depressive disorder, recurrent, mild: Secondary | ICD-10-CM

## 2023-04-14 NOTE — BH Specialist Note (Signed)
 [facility] Follow-up  MRN: [id number] NAME: Sandra Frost Date: 04/14/23  Start time: Start Time: 0900 End time: Stop Time: 0945 Total time: Total Time in Minutes (Visit): 45 Call number: Visit Number: Additional Visit  Reason for call today:  The patient is a 33 year old female who presented for a scheduled collaborative care follow-up. The primary focus of this visit was to discuss transitioning out of the collaborative care program and into traditional outpatient therapy.  The patient has participated in collaborative care intermittently over the past nine months. Although the model is typically short-term, the patient expressed strong comfort and trust in the collaborative care setting and was hesitant to engage with other services. In response, we extended her care to meet her needs during an especially difficult period.  During her time in the program, the patient navigated a series of significant life stressors, including a divorce, the loss of her job and housing, and the ongoing challenges of raising six children. Despite these difficulties, the patient has demonstrated remarkable resilience and growth. She presents today in a positive and optimistic mood, reporting that she is "feeling really good." She has secured a new job, found stable housing, and is in a new, supportive relationship. She also reports that her children are healthy and doing well.  The patient voiced appreciation for the collaborative care support, stating that it helped her navigate a very challenging chapter in her life. At this time, she reports feeling stable and ready to continue her progress in a traditional therapy setting. A referral will be made accordingly.  The patient is being discharged from collaborative care at this time, with encouragement to continue her therapeutic journey and utilize the coping strategies she has developed.   PHQ-9 Scores:     04/14/2023   12:56 PM 12/23/2022    9:38 AM  11/13/2022   11:30 AM 09/23/2022    3:05 PM 07/29/2022    2:58 PM  Depression screen PHQ 2/9  Decreased Interest 0 0 1 2 2   Down, Depressed, Hopeless 1 0 1 2 3   PHQ - 2 Score 1 0 2 4 5   Altered sleeping 3 0 2 3 3   Tired, decreased energy 3 0 1 2 3   Change in appetite 0 1 0 1 1  Feeling bad or failure about yourself  0 0 1 2 0  Trouble concentrating 0 2 1 1  0  Moving slowly or fidgety/restless 0 0 0 0 0  Suicidal thoughts 0 0 0 0 0  PHQ-9 Score 7 3 7 13 12   Difficult doing work/chores Somewhat difficult Somewhat difficult Somewhat difficult Somewhat difficult Very difficult   GAD-7 Scores:     04/14/2023   12:56 PM 12/23/2022    9:39 AM 11/13/2022   11:31 AM 09/23/2022    3:06 PM  GAD 7 : Generalized Anxiety Score  Nervous, Anxious, on Edge 0 3 1 3   Control/stop worrying 0 1 2 2   Worry too much - different things 1 1 1 3   Trouble relaxing 1 1 1 3   Restless 0 0 1 1  Easily annoyed or irritable 0 0 1 1  Afraid - awful might happen 0 0 1 0  Total GAD 7 Score 2 6 8 13   Anxiety Difficulty Somewhat difficult Somewhat difficult Somewhat difficult Somewhat difficult    Stress Current stressors:  Work, kids Sleep:  Poor Appetite:  Good Coping ability:  Good Patient taking medications as prescribed:  Refused medication  Current medications:  Outpatient Encounter Medications  as of 04/14/2023  Medication Sig   fluticasone (FLONASE) 50 MCG/ACT nasal spray Place 2 sprays into both nostrils daily.   loratadine (CLARITIN) 10 MG tablet Take 1 tablet (10 mg total) by mouth daily.   MIRENA, 52 MG, 20 MCG/DAY IUD 1 each by Intrauterine route once. (Patient not taking: Reported on 06/30/2022)   nitroGLYCERIN (NITROSTAT) 0.4 MG SL tablet Place 1 tablet (0.4 mg total) under the tongue every 5 (five) minutes as needed for chest pain.   phentermine 37.5 MG capsule Take 1 capsule (37.5 mg total) by mouth every morning.   Prenatal Vit-Fe Fumarate-FA (PRENATAL MULTIVITAMIN) TABS tablet Take 1 tablet  by mouth daily at 12 noon. (Patient not taking: Reported on 06/30/2022)   propranolol (INDERAL) 20 MG tablet Take 1 tablet (20 mg total) by mouth once as needed for up to 1 dose.   traZODone (DESYREL) 50 MG tablet Take 1 tablet (50 mg total) by mouth at bedtime as needed.   No facility-administered encounter medications on file as of 04/14/2023.     Self-harm Behaviors Risk Assessment Self-harm risk factors:  None Patient endorses recent thoughts of harming self:  Denies   Danger to Others Risk Assessment Danger to others risk factors:  Denies Patient endorses recent thoughts of harming others:  Denies   Substance Use Assessment Patient recently consumed alcohol:  No  Alcohol Use Disorder Identification Test (AUDIT):     07/29/2022    3:15 PM  Alcohol Use Disorder Test (AUDIT)  1. How often do you have a drink containing alcohol? 2  2. How many drinks containing alcohol do you have on a typical day when you are drinking? 1  3. How often do you have six or more drinks on one occasion? 0  AUDIT-C Score 3  4. How often during the last year have you found that you were not able to stop drinking once you had started? 0  5. How often during the last year have you failed to do what was normally expected from you because of drinking? 0  6. How often during the last year have you needed a first drink in the morning to get yourself going after a heavy drinking session? 0  7. How often during the last year have you had a feeling of guilt of remorse after drinking? 0  9. Have you or someone else been injured as a result of your drinking? 0  10. Has a relative or friend or a doctor or another health worker been concerned about your drinking or suggested you cut down? 0  Alcohol Use Disorder Identification Test Final Score (AUDIT) 3   Goals, Interventions and Follow-up Plan Goals:  Maintain improved mood Interventions: CBT Cognitive Behavioral Therapy Follow-up Plan: Refer to [facility]    Reuel Boom

## 2023-04-14 NOTE — Patient Instructions (Addendum)
 Referral:   [facility]  Felix Ahmadi  Primary Location [place], [place] [address]  [phone]  Sandra Frost,  It has been a pleasure working with you and watching your growth during our time together in collaborative care. I believe you are ready to move to the next phase of your treatment journey and continue to flourish in traditional therapy.     Thank you,  Sandra Frost

## 2023-06-09 ENCOUNTER — Encounter: Payer: Self-pay | Admitting: Advanced Practice Midwife

## 2023-06-09 ENCOUNTER — Other Ambulatory Visit: Payer: Self-pay | Admitting: Obstetrics & Gynecology

## 2023-06-09 DIAGNOSIS — Z3490 Encounter for supervision of normal pregnancy, unspecified, unspecified trimester: Secondary | ICD-10-CM | POA: Insufficient documentation

## 2023-06-09 DIAGNOSIS — Z3682 Encounter for antenatal screening for nuchal translucency: Secondary | ICD-10-CM

## 2023-06-09 HISTORY — DX: Encounter for supervision of normal pregnancy, unspecified, unspecified trimester: Z34.90

## 2023-06-12 ENCOUNTER — Encounter: Admitting: Advanced Practice Midwife

## 2023-06-12 ENCOUNTER — Encounter: Admitting: *Deleted

## 2023-06-12 ENCOUNTER — Other Ambulatory Visit

## 2023-06-12 DIAGNOSIS — Z3480 Encounter for supervision of other normal pregnancy, unspecified trimester: Secondary | ICD-10-CM

## 2023-08-11 ENCOUNTER — Encounter: Payer: Self-pay | Admitting: Nurse Practitioner

## 2023-08-11 ENCOUNTER — Ambulatory Visit: Attending: Nurse Practitioner | Admitting: Nurse Practitioner

## 2023-08-11 VITALS — BP 104/64 | HR 83 | Ht 62.0 in | Wt 189.0 lb

## 2023-08-11 DIAGNOSIS — E669 Obesity, unspecified: Secondary | ICD-10-CM

## 2023-08-11 DIAGNOSIS — R079 Chest pain, unspecified: Secondary | ICD-10-CM | POA: Diagnosis not present

## 2023-08-11 DIAGNOSIS — R0602 Shortness of breath: Secondary | ICD-10-CM | POA: Diagnosis present

## 2023-08-11 DIAGNOSIS — R072 Precordial pain: Secondary | ICD-10-CM | POA: Insufficient documentation

## 2023-08-11 DIAGNOSIS — F439 Reaction to severe stress, unspecified: Secondary | ICD-10-CM | POA: Diagnosis not present

## 2023-08-11 MED ORDER — NITROGLYCERIN 0.4 MG SL SUBL: 0.4000 mg | SUBLINGUAL_TABLET | SUBLINGUAL | 1 refills | Status: AC | PRN

## 2023-08-11 NOTE — Patient Instructions (Addendum)
 Medication Instructions:  I will refill your nitroglycerin   Labwork: None today  Testing/Procedures: Your physician has requested that you have an echocardiogram. Echocardiography is a painless test that uses sound waves to create images of your heart. It provides your doctor with information about the size and shape of your heart and how well your heart's chambers and valves are working. This procedure takes approximately one hour. There are no restrictions for this procedure. Please do NOT wear cologne, perfume, aftershave, or lotions (deodorant is allowed). Please arrive 15 minutes prior to your appointment time.  Please note: We ask at that you not bring children with you during ultrasound (echo/ vascular) testing. Due to room size and safety concerns, children are not allowed in the ultrasound rooms during exams. Our front office staff cannot provide observation of children in our lobby area while testing is being conducted. An adult accompanying a patient to their appointment will only be allowed in the ultrasound room at the discretion of the ultrasound technician under special circumstances. We apologize for any inconvenience.   Follow-Up: 3 months with Almarie Miriam PIETY  Any Other Special Instructions Will Be Listed Below (If Applicable).  If you need a refill on your cardiac medications before your next appointment, please call your pharmacy.

## 2023-08-11 NOTE — Progress Notes (Signed)
 Cardiology Office Note   Date:  08/11/2023 ID:  Sandra Frost, DOB [birth year], MRN [id number] PCP: Terry Wilhelmena Lloyd Hilario, FNP  [facility] Providers Cardiologist:  Diannah SHAUNNA Maywood, MD     History of Present Illness Sandra Frost is a 33 y.o. female with a PMH of chest pain, who presents today for CP evaluation.   Last seen by Dr. Maywood on April 13, 2022. Was referred for evaluation of abnormal EKG and chest pain. Noticed CP whenever she was stressed or anxious. Was very active. She was encouraged to reduce stress in her life, no indication for cardiac testing at the time.   Today she presents for CP evaluation. She admits to pain along left side but also admits to both shoulders but mainly to the left shoulder. She also notices discomfort along her right ribs.  No she denies any alleviating or aggravating factors.  Recent episode happen for a day or 2.  Sleep tends to help and remains stable over time.  She says the nitroglycerin  has helped in the past, episodes seem to be happening more often in frequency.  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  Denies any birth control or possibility that she is pregnant.  She tells me she does not plan on having more children in the future.  She is a single mom and has been divorced for the past 2 years. Denies any palpitations, syncope, presyncope, dizziness, orthopnea, PND, swelling or significant weight changes, acute bleeding, or claudication. Admits to shortness of breath with these episodes   ROS: Negative. See HPI.  FH: PCI on maternal side (maternal grandmother) and CHF (maternal great grandmother)  Studies Reviewed  EKG: EKG Interpretation Date/Time:  Friday August 11 2023 13:00:25 EDT Ventricular Rate:  83 PR Interval:  136 QRS Duration:  82 QT Interval:  370 QTC Calculation: 434 R Axis:   50  Text Interpretation: Normal sinus rhythm Cannot rule out Anterior  infarct , age undetermined When compared with ECG of 27-Jan-2017 19:11, Minimal criteria for Anterior infarct are now Present Nonspecific T wave abnormality no longer evident in Anterior leads Confirmed by Miriam Norris [phone]) on 08/11/2023 1:08:25 PM    Physical Exam VS:  BP 104/64 (Cuff Size: Large)   Pulse 83   Ht 5' 2 (1.575 m)   Wt 189 lb (85.7 kg)   LMP 07/20/2023   SpO2 99%   Breastfeeding Unknown   BMI 34.57 kg/m        Wt Readings from Last 3 Encounters:  08/11/23 189 lb (85.7 kg)  06/30/22 221 lb (100.2 kg)  06/02/22 231 lb (104.8 kg)    GEN: bese, 33 y.o. female in no acute distress NECK: No JVD; No carotid bruits CARDIAC: S1/S2, RRR, no murmurs, rubs, gallops RESPIRATORY:  Clear to auscultation without rales, wheezing or rhonchi  ABDOMEN: Soft, non-tender, non-distended EXTREMITIES:  No edema; No deformity   ASSESSMENT AND PLAN  Chest pain of uncertain etiology, shortness of breath Etiology unclear, but does not sound cardiac related, however will arrange Echo d/t her shortness of breath and evaluate for any structural abnormalities. Will provide a refill of Nitroglycerin . Care and ED precautions discussed.   Obesity Weight loss via diet and exercise encouraged. Discussed the impact being overweight would have on cardiovascular risk.  Stress Denies any red flag signs or symptoms. Recommended and discussed ways for stress relief. Continue to follow with PCP. May benefit from  SSRI/therapy.   I spent a total duration of 30 minutes reviewing prior notes, reviewing outside records including  labs, EKG today, face-to-face counseling of  medical condition, pathophysiology, evaluation, management, and documenting the findings in the note.   Dispo: Follow-up with MD/APP in 3 months or sooner if anything changes.   Signed, Almarie Crate, NP

## 2023-08-21 ENCOUNTER — Telehealth: Admitting: Physician Assistant

## 2023-08-21 DIAGNOSIS — L255 Unspecified contact dermatitis due to plants, except food: Secondary | ICD-10-CM

## 2023-08-21 MED ORDER — PREDNISONE 10 MG PO TABS: ORAL_TABLET | ORAL | 0 refills | Status: AC

## 2023-08-21 NOTE — Progress Notes (Signed)
 Virtual Visit Consent   Sandra Frost, you are scheduled for a virtual visit with a [facility] provider today. Just as with appointments in the office, your consent must be obtained to participate. Your consent will be active for this visit and any virtual visit you may have with one of our providers in the next 365 days. If you have a MyChart account, a copy of this consent can be sent to you electronically.  As this is a virtual visit, video technology does not allow for your provider to perform a traditional examination. This may limit your provider's ability to fully assess your condition. If your provider identifies any concerns that need to be evaluated in person or the need to arrange testing (such as labs, EKG, etc.), we will make arrangements to do so. Although advances in technology are sophisticated, we cannot ensure that it will always work on either your end or our end. If the connection with a video visit is poor, the visit may have to be switched to a telephone visit. With either a video or telephone visit, we are not always able to ensure that we have a secure connection.  By engaging in this virtual visit, you consent to the provision of healthcare and authorize for your insurance to be billed (if applicable) for the services provided during this visit. Depending on your insurance coverage, you may receive a charge related to this service.  I need to obtain your verbal consent now. Are you willing to proceed with your visit today? Sandra Frost has provided verbal consent on 08/21/2023 for a virtual visit (video or telephone). Sandra Sandra Dickinson, Sandra Frost  Date: 08/21/2023 12:39 PM   Virtual Visit via Video Note   I, Sandra Frost, connected with  Sandra Frost  ([id number], 12/23/1990) on 08/21/23 at 12:30 PM EDT by a video-enabled telemedicine application and verified that I am speaking with the correct person using two identifiers.  Location: Patient: Virtual Visit Location  Patient: Other: work, isolated Provider: Engineer, mining Provider: Home Office   I discussed the limitations of evaluation and management by telemedicine and the availability of in person appointments. The patient expressed understanding and agreed to proceed.    History of Present Illness: Sandra Frost is a 33 y.o. who identifies as a female who was assigned female at birth, and is being seen today for poison ivy dermatitis.  HPI: Poison [place] This is a new problem. The current episode started in the past 7 days (Tuesday, 08/15/23). The problem has been gradually worsening since onset. The rash is diffuse (started on hands and spread up arm, chest, abdomen, and legs). The rash is characterized by redness, itchiness and blistering. She was exposed to plant contact. Pertinent negatives include no congestion, diarrhea, fatigue, joint pain or shortness of breath. Treatments tried: oatmeal, dawn, bleach, vinegar, calamine. The treatment provided no relief.     Problems:  Patient Active Problem List   Diagnosis Date Noted   Encounter for supervision of normal pregnancy, antepartum 06/09/2023   Morbid obesity due to excess calories (HCC) 02/28/2022   Major depressive disorder 02/14/2022    Allergies:  Allergies  Allergen Reactions   Penicillins Hives    Has patient had a PCN reaction causing immediate rash, facial/tongue/throat swelling, SOB or lightheadedness with hypotension: Yes Has patient had a PCN reaction causing severe rash involving mucus membranes or skin necrosis: No Has patient had a PCN reaction that required hospitalization Yes Has patient had a PCN reaction occurring within  the last 10 years: No If all of the above answers are NO, then may proceed with Cephalosporin use.    Pt states it does not cause anaphylaxis as previously    Medications:  Current Outpatient Medications:    fluticasone  (FLONASE ) 50 MCG/ACT nasal spray, Place 2 sprays into both nostrils daily.,  Disp: 16 g, Rfl: 0   loratadine  (CLARITIN ) 10 MG tablet, Take 1 tablet (10 mg total) by mouth daily., Disp: 30 tablet, Rfl: 0   MIRENA, 52 MG, 20 MCG/DAY IUD, 1 each by Intrauterine route once. (Patient not taking: Reported on 06/30/2022), Disp: , Rfl:    nitroGLYCERIN  (NITROSTAT ) 0.4 MG SL tablet, Place 1 tablet (0.4 mg total) under the tongue every 5 (five) minutes as needed for chest pain., Disp: 25 tablet, Rfl: 1   predniSONE  (DELTASONE ) 10 MG tablet, Days 1-4 take 4 tablets (40 mg) daily  Days 5-8 take 3 tablets (30 mg) daily, Days 9-11 take 2 tablets (20 mg) daily, Days 12-14 take 1 tablet (10 mg) daily., Disp: 37 tablet, Rfl: 0   traZODone  (DESYREL ) 50 MG tablet, Take 1 tablet (50 mg total) by mouth at bedtime as needed., Disp: 30 tablet, Rfl: 3  Observations/Objective: Patient is well-developed, well-nourished in no acute distress.  Resting comfortably at home.  Head is normocephalic, atraumatic.  No labored breathing.  Speech is clear and coherent with logical content.  Patient is alert and oriented at baseline.  Vesicular rash in various stages noted on right hand and forearm, but reports on upper arm, chest, abdomen, and legs also  Assessment and Plan: 1. Dermatitis due to plants, including poison ivy, sumac, and oak (Primary) - predniSONE  (DELTASONE ) 10 MG tablet; Days 1-4 take 4 tablets (40 mg) daily  Days 5-8 take 3 tablets (30 mg) daily, Days 9-11 take 2 tablets (20 mg) daily, Days 12-14 take 1 tablet (10 mg) daily.  Dispense: 37 tablet; Refill: 0  - Plant exposure (poison ivy/poison oak/poison sumac) with rash - Will prescribe Prednisone  - May use topical Hydrocortisone cream, benadryl  cream, and/or calamine lotion for itching - Cool compresses - Luke warm to cool showers - Seek in person evaluation if rash continues to spread or if any appear to become infected   Follow Up Instructions: I discussed the assessment and treatment plan with the patient. The patient was  provided an opportunity to ask questions and all were answered. The patient agreed with the plan and demonstrated an understanding of the instructions.  A copy of instructions were sent to the patient via MyChart unless otherwise noted below.    The patient was advised to call back or seek an in-person evaluation if the symptoms worsen or if the condition fails to improve as anticipated.    Sandra Sandra Dickinson, Sandra Frost

## 2023-08-21 NOTE — Patient Instructions (Signed)
 Lucie Heck, thank you for joining Delon CHRISTELLA Dickinson, PA-C for today's virtual visit.  While this provider is not your primary care provider (PCP), if your PCP is located in our provider database this encounter information will be shared with them immediately following your visit.   A [facility] MyChart account gives you access to today's visit and all your visits, tests, and labs performed at [facility]  click here if you don't have a [facility] MyChart account or go to [email or web address]  Consent: (Patient) Sandra Frost provided verbal consent for this virtual visit at the beginning of the encounter.  Current Medications:  Current Outpatient Medications:    fluticasone  (FLONASE ) 50 MCG/ACT nasal spray, Place 2 sprays into both nostrils daily., Disp: 16 g, Rfl: 0   loratadine  (CLARITIN ) 10 MG tablet, Take 1 tablet (10 mg total) by mouth daily., Disp: 30 tablet, Rfl: 0   MIRENA, 52 MG, 20 MCG/DAY IUD, 1 each by Intrauterine route once. (Patient not taking: Reported on 06/30/2022), Disp: , Rfl:    nitroGLYCERIN  (NITROSTAT ) 0.4 MG SL tablet, Place 1 tablet (0.4 mg total) under the tongue every 5 (five) minutes as needed for chest pain., Disp: 25 tablet, Rfl: 1   predniSONE  (DELTASONE ) 10 MG tablet, Days 1-4 take 4 tablets (40 mg) daily  Days 5-8 take 3 tablets (30 mg) daily, Days 9-11 take 2 tablets (20 mg) daily, Days 12-14 take 1 tablet (10 mg) daily., Disp: 37 tablet, Rfl: 0   traZODone  (DESYREL ) 50 MG tablet, Take 1 tablet (50 mg total) by mouth at bedtime as needed., Disp: 30 tablet, Rfl: 3   Medications ordered in this encounter:  Meds ordered this encounter  Medications   predniSONE  (DELTASONE ) 10 MG tablet    Sig: Days 1-4 take 4 tablets (40 mg) daily  Days 5-8 take 3 tablets (30 mg) daily, Days 9-11 take 2 tablets (20 mg) daily, Days 12-14 take 1 tablet (10 mg) daily.    Dispense:  37 tablet    Refill:  0    Supervising Provider:   BLAISE ALEENE KIDD  [[id number]]     *If you need refills on other medications prior to your next appointment, please contact your pharmacy*  Follow-Up: Call back or seek an in-person evaluation if the symptoms worsen or if the condition fails to improve as anticipated.  [facility] [phone]  Other Instructions Poison Ivy Dermatitis Poison ivy dermatitis is irritation and swelling (inflammation) of the skin caused by chemicals in the leaves of the poison ivy plant. The skin reaction often involves redness, blisters, and extreme itching. What are the causes? This condition is caused by a chemical (urushiol) found in the sap of the poison ivy plant. This chemical is sticky and can easily spread to people, animals, and objects. You can get poison ivy dermatitis by: Having direct contact with a poison ivy plant. Touching animals, other people, or objects that have come in contact with poison ivy and have the chemical on them. What increases the risk? This condition is more likely to develop in people who: Are outdoors often in wooded or [place] areas. Go outdoors without wearing protective clothing, such as closed shoes, long pants, and a long-sleeved shirt. What are the signs or symptoms? Symptoms of this condition include: Redness of the skin. Extreme itching. A rash that often includes bumps and blisters. The rash usually appears 48 hours after exposure, if you have been exposed before. If this is the first time you  have been exposed, the rash may not appear until a week after exposure. Swelling. This may occur if the reaction is more severe. Symptoms usually last for 1-2 weeks. However, the first time you develop this condition, symptoms may last 3-4 weeks. How is this diagnosed? This condition may be diagnosed based on your symptoms and a physical exam. Your health care provider may also ask you about any recent outdoor activity. How is this treated? Treatment for this condition will vary  depending on how severe it is. Treatment may include: Hydrocortisone cream or calamine lotion to relieve itching. Oatmeal baths to soothe the skin. Medicines, such as over-the-counter antihistamine tablets. Oral or injected steroid medicine, for more severe reactions. Follow these instructions at home: Medicines Take or apply over-the-counter and prescription medicines only as told by your health care provider. Use hydrocortisone cream or calamine lotion as needed to soothe the skin and relieve itching. General instructions Do not scratch or rub your skin. Apply a cold, wet cloth (cold compress) to the affected areas or take baths in cool water. This will help with itching. Avoid hot baths and showers. Take oatmeal baths as needed. Use colloidal oatmeal. You can get this at your local pharmacy or grocery store. Follow the instructions on the packaging. Wash all clothes, bedsheets, towels, and blankets you were in contact with between your exposure and appearance of the rash. Check the affected area every day for signs of infection. Check for: More redness, swelling, or pain. Fluid or blood. Warmth. Pus or a bad smell. Keep all follow-up visits. Your health care provider may want to see how your skin is progressing with treatment. How is this prevented?  Learn to identify the poison ivy plant and avoid contact with the plant. This plant can be recognized by the number of leaves. Generally, poison ivy has three leaves with flowering branches on a single stem. The leaves are typically glossy, and they have jagged edges that come to a point. If you have been exposed to poison ivy, thoroughly wash with soap and water right away. You have about 30 minutes to remove the plant resin before it will cause the rash. Be sure to wash under your fingernails, because any plant resin there will continue to spread the rash. When hiking or camping, wear clothes that will help you to avoid skin exposure. This  includes long pants, a long-sleeved shirt, long socks, and hiking boots. You can also apply preventive lotion to your skin to help limit exposure. If you suspect that your clothes or outdoor gear came in contact with poison ivy, rinse them off outside with a garden hose before you bring them inside your house. When doing yard work or gardening, wear gloves, long sleeves, long pants, and boots. Wash your garden tools and gloves if they come in contact with poison ivy. If you suspect that your pet has come into contact with poison ivy, wash them with pet shampoo and water. Make sure to wear gloves while washing your pet. Contact a health care provider if: You have open sores in the rash area. You have any signs of infection. You have redness that spreads beyond the rash area. You have a fever. You have a rash over a large area of your body. You have a rash on your eyes, mouth, or genitals. You have a rash that does not improve after a few weeks. Get help right away if: Your face swells or your eyes swell shut. You have trouble breathing. You  have trouble swallowing. These symptoms may be an emergency. Get help right away. Call 911. Do not wait to see if the symptoms will go away. Do not drive yourself to the hospital. This information is not intended to replace advice given to you by your health care provider. Make sure you discuss any questions you have with your health care provider. Document Revised: 05/20/2021 Document Reviewed: 05/20/2021 Elsevier Patient Education  2024 [employer]vier [employer]   If you have been instructed to have an in-person evaluation today at a local Urgent Care facility, please use the link below. It will take you to a list of all of our available [facility], including address, phone number and hours of operation. Please do not delay care.  [facility]  If you or a family member do not have a primary care provider, use the link below to schedule  a visit and establish care. When you choose a [facility] primary care physician or advanced practice provider, you gain a long-term partner in health. Find a Primary Care Provider  Learn more about [facility]'s in-office and virtual care options:  - Get Care Now

## 2023-08-24 ENCOUNTER — Ambulatory Visit (HOSPITAL_COMMUNITY): Admission: RE | Admit: 2023-08-24 | Source: Ambulatory Visit

## 2023-08-25 ENCOUNTER — Telehealth

## 2023-09-26 ENCOUNTER — Ambulatory Visit: Admitting: Internal Medicine

## 2023-11-16 ENCOUNTER — Ambulatory Visit: Admitting: Nurse Practitioner

## 2024-01-09 ENCOUNTER — Emergency Department (HOSPITAL_COMMUNITY)
Admission: EM | Admit: 2024-01-09 | Discharge: 2024-01-09 | Discharge status: Against Medical Advice | Attending: Emergency Medicine | Admitting: Emergency Medicine

## 2024-01-09 ENCOUNTER — Other Ambulatory Visit: Payer: Self-pay

## 2024-01-09 ENCOUNTER — Encounter (HOSPITAL_COMMUNITY): Payer: Self-pay | Admitting: *Deleted

## 2024-01-09 DIAGNOSIS — R519 Headache, unspecified: Secondary | ICD-10-CM | POA: Insufficient documentation

## 2024-01-09 DIAGNOSIS — R42 Dizziness and giddiness: Secondary | ICD-10-CM | POA: Diagnosis present

## 2024-01-09 DIAGNOSIS — Z5321 Procedure and treatment not carried out due to patient leaving prior to being seen by health care provider: Secondary | ICD-10-CM | POA: Diagnosis not present

## 2024-01-09 HISTORY — DX: Unspecified convulsions: R56.9

## 2024-01-09 LAB — COMPREHENSIVE METABOLIC PANEL WITH GFR
ALT: 46 U/L — ABNORMAL HIGH (ref 0–44)
AST: 30 U/L (ref 15–41)
Albumin: 4.6 g/dL (ref 3.5–5.0)
Alkaline Phosphatase: 55 U/L (ref 38–126)
Anion gap: 15 (ref 5–15)
BUN: 16 mg/dL (ref 6–20)
CO2: 24 mmol/L (ref 22–32)
Calcium: 9 mg/dL (ref 8.9–10.3)
Chloride: 101 mmol/L (ref 98–111)
Creatinine, Ser: 0.67 mg/dL (ref 0.44–1.00)
GFR, Estimated: >60 mL/min
Glucose, Bld: 86 mg/dL (ref 70–99)
Potassium: 4.1 mmol/L (ref 3.5–5.1)
Sodium: 139 mmol/L (ref 135–145)
Total Bilirubin: 0.6 mg/dL (ref 0.0–1.2)
Total Protein: 7.4 g/dL (ref 6.5–8.1)

## 2024-01-09 LAB — CBC
HCT: 40.3 % (ref 36.0–46.0)
Hemoglobin: 14.1 g/dL (ref 12.0–15.0)
MCH: 30.5 pg (ref 26.0–34.0)
MCHC: 35 g/dL (ref 30.0–36.0)
MCV: 87.2 fL (ref 80.0–100.0)
Platelets: 295 K/uL (ref 150–400)
RBC: 4.62 MIL/uL (ref 3.87–5.11)
RDW: 12.9 % (ref 11.5–15.5)
WBC: 6.6 K/uL (ref 4.0–10.5)
nRBC: 0 % (ref 0.0–0.2)

## 2024-01-09 NOTE — ED Triage Notes (Signed)
 Pt BIB [facility] for c/o dizziness while driving, pt pulled off when she began to have blurry vision.  Pt with hx of dizziness in the past.  Pt also seizures, pt does not think this is a seizure. Reported HA x 3 days.
